# Patient Record
Sex: Male | Born: 1945 | Race: White | Hispanic: No | Marital: Married | Smoking: Never smoker
Health system: Southern US, Community
[De-identification: ages and names within clinical notes are randomized; demographics above are authoritative.]

## PROBLEM LIST (undated history)

## (undated) DIAGNOSIS — I4891 Unspecified atrial fibrillation: Secondary | ICD-10-CM

## (undated) DIAGNOSIS — F909 Attention-deficit hyperactivity disorder, unspecified type: Secondary | ICD-10-CM

## (undated) DIAGNOSIS — K5190 Ulcerative colitis, unspecified, without complications: Secondary | ICD-10-CM

## (undated) DIAGNOSIS — M48061 Spinal stenosis, lumbar region without neurogenic claudication: Secondary | ICD-10-CM

## (undated) DIAGNOSIS — M545 Low back pain, unspecified: Secondary | ICD-10-CM

## (undated) DIAGNOSIS — B2790 Infectious mononucleosis, unspecified without complication: Secondary | ICD-10-CM

## (undated) DIAGNOSIS — A071 Giardiasis [lambliasis]: Secondary | ICD-10-CM

## (undated) DIAGNOSIS — C801 Malignant (primary) neoplasm, unspecified: Secondary | ICD-10-CM

## (undated) DIAGNOSIS — K602 Anal fissure, unspecified: Secondary | ICD-10-CM

## (undated) DIAGNOSIS — H9112 Presbycusis, left ear: Secondary | ICD-10-CM

## (undated) DIAGNOSIS — N189 Chronic kidney disease, unspecified: Secondary | ICD-10-CM

## (undated) DIAGNOSIS — E785 Hyperlipidemia, unspecified: Secondary | ICD-10-CM

## (undated) HISTORY — DX: Infectious mononucleosis, unspecified without complication: B27.90

## (undated) HISTORY — PX: PROSTATE SURGERY: SHX751

## (undated) HISTORY — DX: Chronic kidney disease, unspecified: N18.9

## (undated) HISTORY — DX: Low back pain: M54.5

## (undated) HISTORY — DX: Low back pain, unspecified: M54.50

## (undated) HISTORY — PX: ANAL FISSURE REPAIR: SHX2312

## (undated) HISTORY — PX: APPENDECTOMY: SHX54

## (undated) HISTORY — DX: Spinal stenosis, lumbar region without neurogenic claudication: M48.061

## (undated) HISTORY — DX: Presbycusis, left ear: H91.12

## (undated) HISTORY — DX: Attention-deficit hyperactivity disorder, unspecified type: F90.9

## (undated) HISTORY — DX: Malignant (primary) neoplasm, unspecified: C80.1

## (undated) HISTORY — PX: VARICOCELE EXCISION: SUR582

## (undated) HISTORY — DX: Giardiasis (lambliasis): A07.1

## (undated) HISTORY — DX: Hyperlipidemia, unspecified: E78.5

## (undated) HISTORY — PX: ROTATOR CUFF REPAIR: SHX139

## (undated) HISTORY — DX: Anal fissure, unspecified: K60.2

## (undated) HISTORY — DX: Ulcerative colitis, unspecified, without complications: K51.90

## (undated) HISTORY — DX: Unspecified atrial fibrillation: I48.91

---

## 2000-03-17 ENCOUNTER — Ambulatory Visit (HOSPITAL_COMMUNITY): Admission: RE | Admit: 2000-03-17 | Discharge: 2000-03-17 | Payer: Self-pay | Admitting: Gastroenterology

## 2003-01-04 ENCOUNTER — Encounter: Payer: Self-pay | Admitting: General Surgery

## 2003-01-04 ENCOUNTER — Encounter: Admission: RE | Admit: 2003-01-04 | Discharge: 2003-01-04 | Payer: Self-pay | Admitting: General Surgery

## 2003-01-12 ENCOUNTER — Encounter (INDEPENDENT_AMBULATORY_CARE_PROVIDER_SITE_OTHER): Payer: Self-pay | Admitting: Specialist

## 2003-01-12 ENCOUNTER — Ambulatory Visit (HOSPITAL_BASED_OUTPATIENT_CLINIC_OR_DEPARTMENT_OTHER): Admission: RE | Admit: 2003-01-12 | Discharge: 2003-01-12 | Payer: Self-pay | Admitting: General Surgery

## 2007-01-29 ENCOUNTER — Ambulatory Visit (HOSPITAL_COMMUNITY): Admission: RE | Admit: 2007-01-29 | Discharge: 2007-01-29 | Payer: Self-pay | Admitting: Urology

## 2007-03-10 ENCOUNTER — Inpatient Hospital Stay (HOSPITAL_COMMUNITY): Admission: RE | Admit: 2007-03-10 | Discharge: 2007-03-11 | Payer: Self-pay | Admitting: Urology

## 2007-03-10 ENCOUNTER — Encounter (INDEPENDENT_AMBULATORY_CARE_PROVIDER_SITE_OTHER): Payer: Self-pay | Admitting: Urology

## 2009-01-17 ENCOUNTER — Encounter: Admission: RE | Admit: 2009-01-17 | Discharge: 2009-01-17 | Payer: Self-pay | Admitting: Rheumatology

## 2009-01-19 ENCOUNTER — Ambulatory Visit (HOSPITAL_COMMUNITY): Admission: RE | Admit: 2009-01-19 | Discharge: 2009-01-19 | Payer: Self-pay | Admitting: Rheumatology

## 2009-11-09 ENCOUNTER — Ambulatory Visit (HOSPITAL_COMMUNITY): Admission: RE | Admit: 2009-11-09 | Discharge: 2009-11-09 | Payer: Self-pay | Admitting: Orthopaedic Surgery

## 2009-11-30 ENCOUNTER — Ambulatory Visit (HOSPITAL_BASED_OUTPATIENT_CLINIC_OR_DEPARTMENT_OTHER): Admission: RE | Admit: 2009-11-30 | Discharge: 2009-11-30 | Payer: Self-pay | Admitting: Orthopaedic Surgery

## 2009-12-19 ENCOUNTER — Encounter: Admission: RE | Admit: 2009-12-19 | Discharge: 2010-03-15 | Payer: Self-pay | Admitting: Orthopaedic Surgery

## 2010-11-29 LAB — POCT HEMOGLOBIN-HEMACUE: Hemoglobin: 17.1 g/dL — ABNORMAL HIGH (ref 13.0–17.0)

## 2011-01-21 NOTE — H&P (Signed)
NAME:  Daniel Payne, Daniel Payne NO.:  [id number]   MEDICAL RECORD NO.:  [id number]          PATIENT TYPE:  INP   LOCATION:  51                         FACILITY:  [facility]   PHYSICIAN:  Duane Lope. Rosana Hoes, M.D.  DATE OF BIRTH:  1946-07-24   DATE OF ADMISSION:  03/10/2007  DATE OF DISCHARGE:                              HISTORY & PHYSICAL   CHIEF COMPLAINT:  I have prostate cancer.   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  He subsequently  underwent ultrasound and biopsy of the prostate which revealed a Gleason  score 7 which was 3+4 adenocarcinoma, 40% of three cores from the right  base and a lower grade Gleason score 6, 3+3 from the right apex.  His  metastatic workup was negative.  After understanding risks, benefits and  alternatives, he elected to proceed with a robotic-assisted radical  prostatectomy with bilateral pelvic lymphadenectomy.   PAST MEDICAL HISTORY:   ALLERGIES:  He has no known allergies.   MEDICATIONS:  1. Concerta.  2. Benadryl.  3. Vitamins.  4. Occasional ibuprofen.   ILLNESSES:  He has ADHD only.   SURGERIES:  He has had a right orchiopexy as a child; he has an atrophic  high-riding right testicle.   SOCIAL HISTORY:  He is a negative smoker, a social drinker.   FAMILY HISTORY:  Nonsignificant.   REVIEW OF SYSTEMS:  Has no shortness of breath, dyspnea on exertion,  chest pain or GI complaints.   PHYSICAL EXAMINATION:  VITAL SIGNS:  He is afebrile.  Vital signs  stable.  GENERAL:  He is well-nourished, well-developed, thin, in no acute  distress, oriented x3.  HEENT:  Normal.  NECK:  Supple without masses, thyromegaly or bruits.  CHEST:  Clear  without rales or rhonchi.  HEART:  Normal sinus rhythm without murmurs, gallops.  ABDOMEN:  Soft, nontender, without masses or organomegaly.  EXTREMITIES:  Normal.  NEUROLOGIC:  Intact.  SKIN:  Normal.  GENITOURINARY:  Penis, meatus, scrotum, testicle, _________ appear  normal.  RECTAL:  The prostate is 30 grams.  Sight soft nodularity but no  specific findings.   IMPRESSION:  Clinical stage T1c adenocarcinoma of prostate.   PLAN:  Robotic-assisted radical prostatectomy with bilateral pelvic  lymphadenectomy.      [place] Rosana Hoes, M.D.  Electronically Signed     RLD/MEDQ  D:  03/10/2007  T:  03/11/2007  Job:  [id number]

## 2011-01-21 NOTE — Op Note (Signed)
NAME:  Daniel Payne, Daniel Payne NO.:  [id number]   MEDICAL RECORD NO.:  [id number]          PATIENT TYPE:  INP   LOCATION:  13                         FACILITY:  [facility]   PHYSICIAN:  Duane Lope. Rosana Hoes, M.D.  DATE OF BIRTH:  Mar 06, 1946   DATE OF PROCEDURE:  03/10/2007  DATE OF DISCHARGE:                               OPERATIVE REPORT   DIAGNOSIS:  Adenocarcinoma of the prostate.   OPERATIVE PROCEDURE:  Robotic-assisted radical prostatectomy and  bilateral pelvic lymphadenectomy.   SURGEON:  Tresa Endo.   ASSISTANT:  Raynelle Bring.   ANESTHESIA:  General endotracheal.   ESTIMATED BLOOD LOSS:  150 mL.   TUBES:  Large round Blake drain, 20-French coude Foley catheter.   COMPLICATIONS:  None.   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  He was found to have a slight nodularity of the prostate on the  left side and subsequently underwent ultrasound and biopsy of the  prostate that revealed a Gleason score 7 which was 4 + 3 adenocarcinoma  in 40% of the biopsy from the right base of his prostate and a Gleason  score 6 which was 3 + 3 from the right apex.  Metastatic workup was  negative.  After understanding risks, benefits and alternatives, it was  elected to proceed with robotic-assisted radical retropubic radical  prostatectomy with pelvic lymphadenectomy.   PROCEDURE IN DETAIL:  The patient was placed in a supine position.  After proper general tracheal anesthesia, he was placed in exaggerated  lithotomy position, prepped and draped with Betadine in sterile fashion.  A 24-French Foley catheter was inserted with 30 mL balloon, and the  bladder was drained.  A left periumbilical incision was made, and a 12-  French cannula was placed directly, and the abdomen was insufflated with  carbon dioxide and inspected.  There was one adhesion on the right side,  but  we were able to avoid it.  No other lesions were noted to be  present.  Right and left robotic arms were placed one handbreadth  lateral to the periumbilical port.  A fourth arm port was placed lateral  and slightly superior to the left arm port, and two working ports, a 5-  mm left port and a 12-mm right port, were placed medial and lateral to  the right working arm port under direct vision.  The robot was docked,  and the dissection was begun.  The space of Retzius was entered.  The  anterior bladder flap was taken down, and the medial and median  umbilical ligaments were taken down.  Area was defatted, and the  endopelvic fascia was dissected bilaterally.  The puboprostatic  ligaments were partially resected.  Following this, the dorsal vein  complex was then clamped, cut with an Endo-GIA stapler, and the bladder  neck was approached and dissected from the prostate, carried  posteriorly, and the seminal vesicles and ampulla of vas deferens were  taken down, and ampulla of vas deferens were amputated,  and good  hemostasis was noted be present.  Denonvilliers fascia was then entered,  and the plane was created posterior to the prostate.  A nerve spare was  then performed, and the posterior lateral tissue and a veil technique  was taken down sharply avoiding cautery to retract both of the nerves  laterally, and the vascular pedicles were taken down in serial packets  and clipped with Hem-o-lok clips.  The dissection was carried to the  apex of prostate, again carefully avoiding the neurovascular bundle, and  the apex and urethra were taken down sharply, and the speculum was  placed in the right lower quadrant.  Good hemostasis was noted to be  present.  Bilateral lymphadenectomy was performed.  All lymphatics were  clipped.  Obturator nerves were identified and protected bilaterally,  and obturator and external iliac lymph nodes progressing to the  hypogastric lymph nodes were taken down  and were submitted separately to  pathology, and good hemostasis noted be present.  Utilizing a 2-0 Vicryl  stitch, a supporting stitch was placed on Denonvilliers fascia to the  posterior urethral area, and a holding stitch was placed in the 6  o'clock position from the urethra to the bladder neck.  The bladder neck  appeared to be intact.  Utilizing a 3-0 Monocryl dyed and undyed suture  with the knot posterior to the bladder, a running anastomosis was  performed and tied anteriorly, and a 20-French coude catheter was easily  passed to the bladder, and the bladder was noted to irrigate clear.  Indigo carmine had been given, and blue dye was noted to be easily  irrigated.  Reinspection revealed good hemostasis was present.  The  anastomosis appeared to be in shape.  The robot was undocked and moved  away, and the right 12-mm port was closed with a suture passer and 2-0  Vicryl suture under direct vision.  The specimen was captured through  the periumbilical port with an EndoCatch bag, and other ports were  removed under direct vision, and there was no significant bleeding.  The  specimen was then removed periumbilically.  The fascia was closed with a  running of 2-0 Vicryl suture.  It might be noted that a large round  Blake drain had been placed through the fourth arm port, placed into the  pelvis and sutured in place with nylon, and all wounds were injected  with quarter percent Marcaine and closed with skin staples and dressed  in a sterile fashion.  The patient tolerated the procedure well.  There  were no complications, and he was taken to the recovery room stable.      [place] Rosana Hoes, M.D.  Electronically Signed     RLD/MEDQ  D:  03/10/2007  T:  03/11/2007  Job:  [id number]

## 2011-01-24 NOTE — Op Note (Signed)
NAME:  Daniel Payne, Daniel Payne                          ACCOUNT NO.:  [id number]   MEDICAL RECORD NO.:  [id number]                   PATIENT TYPE:  AMB   LOCATION:  [place]                                  FACILITY:  [facility]   PHYSICIAN:  Orson Ape. Rise Patience, M.D.          DATE OF BIRTH:  07-May-1946   DATE OF PROCEDURE:  01/12/2003  DATE OF DISCHARGE:                                 OPERATIVE REPORT   PREOPERATIVE DIAGNOSIS:  Chronic posterior anal fissure.   POSTOPERATIVE DIAGNOSIS:  Chronic posterior anal fissure.   OPERATION PERFORMED:  Excision of chronic posterior anal fissure and  internal sphincterotomy.   SURGEON:  Orson Ape. Rise Patience, M.D.   ANESTHESIA:  General.   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  He saw me approximately two months ago  with a chronic lump and pain posterior to his anus.  He has had a  colonoscopy by Dr. Sammuel Cooper and no abnormalities were noted.  He has noticed  a little soiling and occasionally a little bleeding.  He thought this was a  fissure but he had treated himself with hemorrhoidal creams, ointments and  etc. but continued to have symptoms.  On examination, you could see a  chronic posterior fissure with kind of a hypertrophied pile and I  recommended we continue with the conservative management, but if the  symptoms were just not resolving, to proceed with an internal  sphincterotomy.  Over the next month there was basically no improvement and  he is scheduled now for an internal sphincterotomy.  His health otherwise is  good.   Preoperative laboratory studies, EKG, chest x-ray, CBC and CMET were normal.   DESCRIPTION OF PROCEDURE:  The patient was given a gram of Kefzol, induction  of general anesthesia and LOA tube and then placed in lithotomy position.  In this position, you could see this chronic posterior fissure with  hypertrophied skin edges and an anoscope was placed  and he has very minimal  internal hemorrhoids in the other quadrant.  This is more of a fissure.  You  can see the exposed sphincter fibers.  Because of the little polypoid like  tissue posterior, I elected to go ahead and excise this and do the  sphincterotomy posterior instead of doing a lateral sphincterotomy.  The  patient was then prepped with Betadine surgical scrub and solution and  draped in a sterile manner.  With the anoscope in the area, I elected to  just debride this hypertrophied tissue around this, elevating it off the  internal sphincter and then a hemostat was placed between the posterior and  internal sphincter.  The sphincter itself is not extremely large, but he is  of small stature.  There is kind of like a bridge defect where this catches  stool.  The sphincter was divided.  There was a little bleeder  that was  controlled with cautery and then the proximal hypertrophied tissue was also  debrided.  I did send this specimen for permanent path but I think it is  just chronic irritated tissue.  Next, the mucosal edges starting up high  were sutured with a running 2-0 chromic closing the little skin over the  sphincter.  I left the most posterior portion open so if there is any  drainage, it would have an escape route.  I placed one subcuticular 2-0  chromic suture most posterior.  Then we had the patient basically lightened  with anesthesia and I think that the sphincter internal portion has been  completed divided and then I put about 58m of 0.5% Marcaine with Adrenalin  in for postoperative pain and spasm.  Xylocaine ointment was applied and 4 x  4s  then held in place with a little stretch bandage.  The patient tolerated the  procedure nicely and will be released after a short stay in the recovery  room.  He hopes to be able to return to work next week and he will keep his  stool soft but try to avoid diarrhea.                                                 WOrson Ape WRise Patience M.D.    WJW/MEDQ  D:  01/12/2003  T:  01/13/2003  Job:  [id number]

## 2011-06-09 ENCOUNTER — Telehealth: Payer: Self-pay | Admitting: *Deleted

## 2011-06-09 NOTE — Telephone Encounter (Signed)
A user error has taken place: pull up wrong patient....06/09/11@3 :53pm/LMB

## 2011-06-24 LAB — BASIC METABOLIC PANEL
BUN: 9
CO2: 26
Calcium: 8.3 — ABNORMAL LOW
Chloride: 100
Creatinine, Ser: 1.02
GFR calc Af Amer: >60
GFR calc non Af Amer: >60
Glucose, Bld: 185 — ABNORMAL HIGH
Potassium: 3.9
Sodium: 131 — ABNORMAL LOW

## 2011-06-24 LAB — DIFFERENTIAL
Basophils Absolute: 0
Basophils Relative: 0
Eosinophils Absolute: 0
Eosinophils Relative: 0
Lymphocytes Relative: 15
Lymphs Abs: 1.3
Monocytes Absolute: 0.7
Monocytes Relative: 9
Neutro Abs: 6.5
Neutrophils Relative %: 76

## 2011-06-24 LAB — CBC
HCT: 38.3 — ABNORMAL LOW
Hemoglobin: 13.4
MCHC: 35
MCV: 89.9
Platelets: 190
RBC: 4.26
RDW: 12.8
WBC: 8.6

## 2011-06-24 LAB — TYPE AND SCREEN
ABO/RH(D): AB POS
Antibody Screen: NEGATIVE

## 2011-06-24 LAB — ABO/RH: ABO/RH(D): AB POS

## 2011-06-25 LAB — PROTIME-INR
INR: 1
Prothrombin Time: 13.4

## 2011-06-25 LAB — COMPREHENSIVE METABOLIC PANEL
ALT: 26
AST: 23
Albumin: 3.7
Alkaline Phosphatase: 57
BUN: 24 — ABNORMAL HIGH
CO2: 27
Calcium: 9.7
Chloride: 102
Creatinine, Ser: 1.35
GFR calc Af Amer: >60
GFR calc non Af Amer: 54 — ABNORMAL LOW
Glucose, Bld: 91
Potassium: 4.2
Sodium: 139
Total Bilirubin: 0.7
Total Protein: 7.1

## 2011-06-25 LAB — URINALYSIS, ROUTINE W REFLEX MICROSCOPIC
Bilirubin Urine: NEGATIVE
Glucose, UA: NEGATIVE
Hgb urine dipstick: NEGATIVE
Ketones, ur: NEGATIVE
Nitrite: NEGATIVE
Protein, ur: NEGATIVE
Specific Gravity, Urine: 1.023
Urobilinogen, UA: 0.2
pH: 7

## 2011-06-25 LAB — CBC
HCT: 44.1
Hemoglobin: 15.6
MCHC: 35.2
MCV: 89.3
Platelets: 245
RBC: 4.94
RDW: 12.9
WBC: 6.9

## 2011-06-25 LAB — APTT: aPTT: 28

## 2012-10-27 DIAGNOSIS — M48061 Spinal stenosis, lumbar region without neurogenic claudication: Secondary | ICD-10-CM | POA: Diagnosis not present

## 2013-03-22 DIAGNOSIS — Z79899 Other long term (current) drug therapy: Secondary | ICD-10-CM | POA: Diagnosis not present

## 2013-03-22 DIAGNOSIS — C61 Malignant neoplasm of prostate: Secondary | ICD-10-CM | POA: Diagnosis not present

## 2013-03-22 DIAGNOSIS — H9190 Unspecified hearing loss, unspecified ear: Secondary | ICD-10-CM | POA: Diagnosis not present

## 2013-03-22 DIAGNOSIS — Z0000 Encounter for general adult medical examination without abnormal findings: Secondary | ICD-10-CM | POA: Diagnosis not present

## 2013-03-22 DIAGNOSIS — E7800 Pure hypercholesterolemia, unspecified: Secondary | ICD-10-CM | POA: Diagnosis not present

## 2013-03-29 DIAGNOSIS — E7800 Pure hypercholesterolemia, unspecified: Secondary | ICD-10-CM | POA: Diagnosis not present

## 2013-03-29 DIAGNOSIS — C61 Malignant neoplasm of prostate: Secondary | ICD-10-CM | POA: Diagnosis not present

## 2013-03-29 DIAGNOSIS — Z79899 Other long term (current) drug therapy: Secondary | ICD-10-CM | POA: Diagnosis not present

## 2013-04-06 DIAGNOSIS — H905 Unspecified sensorineural hearing loss: Secondary | ICD-10-CM | POA: Diagnosis not present

## 2013-04-06 DIAGNOSIS — H903 Sensorineural hearing loss, bilateral: Secondary | ICD-10-CM | POA: Diagnosis not present

## 2013-06-14 DIAGNOSIS — Z79899 Other long term (current) drug therapy: Secondary | ICD-10-CM | POA: Diagnosis not present

## 2013-06-14 DIAGNOSIS — E7800 Pure hypercholesterolemia, unspecified: Secondary | ICD-10-CM | POA: Diagnosis not present

## 2013-06-21 DIAGNOSIS — Z23 Encounter for immunization: Secondary | ICD-10-CM | POA: Diagnosis not present

## 2014-03-21 DIAGNOSIS — C61 Malignant neoplasm of prostate: Secondary | ICD-10-CM | POA: Diagnosis not present

## 2014-03-28 DIAGNOSIS — Z0000 Encounter for general adult medical examination without abnormal findings: Secondary | ICD-10-CM | POA: Diagnosis not present

## 2014-03-28 DIAGNOSIS — Z23 Encounter for immunization: Secondary | ICD-10-CM | POA: Diagnosis not present

## 2014-03-28 DIAGNOSIS — Z1331 Encounter for screening for depression: Secondary | ICD-10-CM | POA: Diagnosis not present

## 2014-03-28 DIAGNOSIS — K625 Hemorrhage of anus and rectum: Secondary | ICD-10-CM | POA: Diagnosis not present

## 2014-03-28 DIAGNOSIS — Z79899 Other long term (current) drug therapy: Secondary | ICD-10-CM | POA: Diagnosis not present

## 2014-03-28 DIAGNOSIS — E7800 Pure hypercholesterolemia, unspecified: Secondary | ICD-10-CM | POA: Diagnosis not present

## 2014-05-09 DIAGNOSIS — K921 Melena: Secondary | ICD-10-CM | POA: Diagnosis not present

## 2014-05-09 DIAGNOSIS — R197 Diarrhea, unspecified: Secondary | ICD-10-CM | POA: Diagnosis not present

## 2014-05-17 DIAGNOSIS — K921 Melena: Secondary | ICD-10-CM | POA: Diagnosis not present

## 2014-05-26 ENCOUNTER — Other Ambulatory Visit: Payer: Self-pay | Admitting: Gastroenterology

## 2014-05-26 DIAGNOSIS — R195 Other fecal abnormalities: Secondary | ICD-10-CM | POA: Diagnosis not present

## 2014-05-26 DIAGNOSIS — R197 Diarrhea, unspecified: Secondary | ICD-10-CM | POA: Diagnosis not present

## 2014-05-26 DIAGNOSIS — K5289 Other specified noninfective gastroenteritis and colitis: Secondary | ICD-10-CM | POA: Diagnosis not present

## 2014-05-26 DIAGNOSIS — K5730 Diverticulosis of large intestine without perforation or abscess without bleeding: Secondary | ICD-10-CM | POA: Diagnosis not present

## 2014-05-26 DIAGNOSIS — D126 Benign neoplasm of colon, unspecified: Secondary | ICD-10-CM | POA: Diagnosis not present

## 2014-05-26 DIAGNOSIS — K6389 Other specified diseases of intestine: Secondary | ICD-10-CM | POA: Diagnosis not present

## 2014-06-13 DIAGNOSIS — K5150 Left sided colitis without complications: Secondary | ICD-10-CM | POA: Diagnosis not present

## 2014-07-01 DIAGNOSIS — Z23 Encounter for immunization: Secondary | ICD-10-CM | POA: Diagnosis not present

## 2014-07-25 DIAGNOSIS — K5150 Left sided colitis without complications: Secondary | ICD-10-CM | POA: Diagnosis not present

## 2014-08-02 DIAGNOSIS — K921 Melena: Secondary | ICD-10-CM | POA: Diagnosis not present

## 2014-08-23 DIAGNOSIS — H2513 Age-related nuclear cataract, bilateral: Secondary | ICD-10-CM | POA: Diagnosis not present

## 2014-09-12 DIAGNOSIS — K5150 Left sided colitis without complications: Secondary | ICD-10-CM | POA: Diagnosis not present

## 2014-10-02 DIAGNOSIS — K5150 Left sided colitis without complications: Secondary | ICD-10-CM | POA: Diagnosis not present

## 2014-10-02 DIAGNOSIS — Z111 Encounter for screening for respiratory tuberculosis: Secondary | ICD-10-CM | POA: Diagnosis not present

## 2014-10-10 ENCOUNTER — Other Ambulatory Visit: Payer: Self-pay | Admitting: Gastroenterology

## 2014-10-10 DIAGNOSIS — K5150 Left sided colitis without complications: Secondary | ICD-10-CM | POA: Diagnosis not present

## 2014-10-10 DIAGNOSIS — Z5181 Encounter for therapeutic drug level monitoring: Secondary | ICD-10-CM | POA: Diagnosis not present

## 2014-10-10 DIAGNOSIS — K5190 Ulcerative colitis, unspecified, without complications: Secondary | ICD-10-CM | POA: Diagnosis not present

## 2014-10-10 DIAGNOSIS — K589 Irritable bowel syndrome without diarrhea: Secondary | ICD-10-CM | POA: Diagnosis not present

## 2014-10-10 DIAGNOSIS — Z09 Encounter for follow-up examination after completed treatment for conditions other than malignant neoplasm: Secondary | ICD-10-CM | POA: Diagnosis not present

## 2014-10-10 DIAGNOSIS — Z8719 Personal history of other diseases of the digestive system: Secondary | ICD-10-CM | POA: Diagnosis not present

## 2014-10-11 DIAGNOSIS — E875 Hyperkalemia: Secondary | ICD-10-CM | POA: Diagnosis not present

## 2014-10-19 DIAGNOSIS — I4891 Unspecified atrial fibrillation: Secondary | ICD-10-CM | POA: Diagnosis not present

## 2014-10-30 DIAGNOSIS — S299XXA Unspecified injury of thorax, initial encounter: Secondary | ICD-10-CM | POA: Diagnosis not present

## 2014-10-30 DIAGNOSIS — Y92838 Other recreation area as the place of occurrence of the external cause: Secondary | ICD-10-CM | POA: Diagnosis not present

## 2014-10-30 DIAGNOSIS — S2020XA Contusion of thorax, unspecified, initial encounter: Secondary | ICD-10-CM | POA: Diagnosis not present

## 2014-11-07 ENCOUNTER — Ambulatory Visit (INDEPENDENT_AMBULATORY_CARE_PROVIDER_SITE_OTHER): Payer: Medicare Other | Admitting: Cardiovascular Disease

## 2014-11-07 ENCOUNTER — Encounter: Payer: Self-pay | Admitting: Cardiovascular Disease

## 2014-11-07 VITALS — BP 132/74 | HR 87 | Ht 68.5 in | Wt 155.0 lb

## 2014-11-07 DIAGNOSIS — C61 Malignant neoplasm of prostate: Secondary | ICD-10-CM | POA: Diagnosis not present

## 2014-11-07 DIAGNOSIS — I480 Paroxysmal atrial fibrillation: Secondary | ICD-10-CM | POA: Diagnosis not present

## 2014-11-07 DIAGNOSIS — R079 Chest pain, unspecified: Secondary | ICD-10-CM

## 2014-11-07 DIAGNOSIS — Z5181 Encounter for therapeutic drug level monitoring: Secondary | ICD-10-CM | POA: Diagnosis not present

## 2014-11-07 NOTE — Progress Notes (Signed)
Cardiology Office Note   Date:  11/07/2014   ID:  Daniel Payne, DOB 11-01-1945, MRN [id number]  PCP:  No primary care provider on file.  Cardiologist:   Kyesha Balla, Wonda Cheng, MD   Chief Complaint  Patient presents with  . Atrial Fibrillation   1. Problem List:  1. Paroxysmal Atrial fib 2. Hyperlipidemia 3. Prostate surgery-status post robotic prostatectomy 4. Ulcerative colitis   History of Present Illness: Daniel Payne is a 69 y.o. 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  Has not fully controlled the bleeding. Best controlled by steroids.    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 He was cycling and went into rapid Afib with rate of 170.  He converted back to NSR in 3-4 hours. Had another episode of Afib while working in his garden.  Went to Dr.  Carlyle Lipa office . Afib was documeneted.  Was started on Dilt 30 QID.  Converted to NSR the next day He stopped the Dilt a week or so later.  Has developed episodes of sinus tachycardia recently.  Also associated with some chest tightness.  Does not last long.   This am he had very slight chest tightness while he was walking.  Lasted 5  Minutes.  No diaphoresis, no radiation.  Drinks wine with dinner.   Past Medical History  Diagnosis Date  . Low back pain   . Hyperlipidemia   . Fissure, anal   . ADHD (attention deficit hyperactivity disorder)   . Giardia     working in [place]  . Infectious mononucleosis   . Cancer     prostate dr. Lawerance Bach  . Spinal stenosis of lumbar region   . Chronic kidney disease     borderline stage III  . Presbycusis of left ear   . Ulcerative colitis   . A-fib     Past Surgical History  Procedure Laterality Date  . Prostate surgery    . Anal fissure repair    . Varicocele excision Right   . Appendectomy    . Rotator cuff repair Left       Current Outpatient Prescriptions  Medication Sig Dispense Refill  . atorvastatin (LIPITOR) 10 MG tablet Take 10 mg by mouth daily.  11  . ibuprofen (ADVIL,MOTRIN) 600 MG tablet Take 600 mg by mouth at bedtime.     . mercaptopurine (PURINETHOL) 50 MG tablet Take 50 mg by mouth daily.   12  . predniSONE (DELTASONE) 5 MG tablet Take 15 mg by mouth daily with breakfast.   3   No current facility-administered medications for this visit.    Allergies:   Concerta; Hydrocortisone; and Lialda    Social History:  The patient  reports that he has never smoked. He does not have any smokeless tobacco history on file. He reports that he drinks alcohol. He reports that he does not use illicit drugs.   Family History:  The patient's family history includes Stroke (age of onset: 58) in his mother.    ROS:  Please see the history of present illness.    Review of Systems: Constitutional:  denies fever, chills, diaphoresis, appetite change and fatigue.  HEENT: denies photophobia, eye pain, redness, hearing loss, ear pain, congestion, sore throat, rhinorrhea, sneezing, neck pain, neck stiffness and tinnitus.  Respiratory: denies SOB, DOE, cough, chest tightness, and wheezing.  Cardiovascular: admits to chest pain, palpitations    Gastrointestinal: denies nausea, vomiting, abdominal pain, diarrhea, constipation, blood in stool.  Genitourinary: denies dysuria, urgency, frequency, hematuria, flank pain and difficulty urinating.  Musculoskeletal: denies  myalgias, back pain, joint swelling, arthralgias and gait problem.   Skin: denies pallor, rash and wound.  Neurological: denies dizziness, seizures, syncope, weakness, light-headedness, numbness and headaches.   Hematological: denies adenopathy, easy bruising, personal or family bleeding history.  Psychiatric/ Behavioral: denies suicidal ideation, mood changes, confusion, nervousness, sleep disturbance and agitation.       All other systems are  reviewed and negative.    PHYSICAL EXAM: VS:  BP 132/76 mmHg  Pulse 87  Ht 5' 8.5" (1.74 m) , BMI There is no weight on file to calculate BMI. GEN: Well nourished, well developed, in no acute distress HEENT: normal Neck: no JVD, carotid bruits, or masses Cardiac: RRR; no murmurs, rubs, or gallops,no edema  Respiratory:  clear to auscultation bilaterally, normal work of breathing GI: soft, nontender, nondistended, + BS MS: no deformity or atrophy Skin: warm and dry, no rash Neuro:  Strength and sensation are intact Psych: normal   EKG:  EKG is ordered today. The ekg ordered today demonstrates normal sinus rhythm at a rate of 87. He has no ST or T wave changes.   Recent Labs: No results found for requested labs within last 365 days.    Lipid Panel No results found for: CHOL, TRIG, HDL, CHOLHDL, VLDL, LDLCALC, LDLDIRECT    Wt Readings from Last 3 Encounters:  No data found for Wt      Other studies Reviewed: Additional studies/ records that were reviewed today include: . Review of the above records demonstrates:    ASSESSMENT AND PLAN:  1.  Paroxysmal atrial fibrillation- can presents with episodes of paroxysmal atrial fibrillation. He's had 2 episodes. One lasted for 3 hours and the other lasted for about a day. His HADS2 VASC score is 1 ( age) .  We discussed starting him on anti-coagulation. He still having some issues with colitis that he would see would like to hold off on starting any anticoagulations at this time. In addition, he is not really having much in the way of atrial fibrillation. He took diltiazem for several days but now is discontinued it. He's not had any recurrent episodes of atrial flutter ablation. I'm not inclined to start him on any additional medications at this time.  We'll get an echocardiogram for further evaluation of his LV function and valvular function. Because he also has had some chest discomfort associated with a sinus tachycardia, we'll  get a stress Myoview study.  2. Chest discomfort - he's had some episodes of chest discomfort. These are fairly atypical for him  so this is a bit concerning. 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  I'll see him again in 3 months for follow-up visit.   Current medicines are reviewed at length with the patient today.  The patient does not have concerns regarding medicines.  The following changes have been made:  no change   Disposition:   FU with me in 3 months.     Signed, Gearlene Godsil, Wonda Cheng, MD  11/07/2014 1:57 PM    [facility] [place], [place], [place]  [address] Phone: [phone]; Fax: [phone]

## 2014-11-07 NOTE — Patient Instructions (Addendum)
Your physician has requested that you have en exercise stress myoview. For further information please visit [email or web address]. Please follow instruction sheet, as given.  Your physician recommends that you continue on your current medications as directed. Please refer to the Current Medication list given to you today.  Your physician has requested that you have an echocardiogram. Echocardiography is a painless test that uses sound waves to create images of your heart. It provides your doctor with information about the size and shape of your heart and how well your heart's chambers and valves are working. This procedure takes approximately one hour. There are no restrictions for this procedure.   Your physician recommends that you schedule a follow-up appointment in: 3 months with Dr. Acie Fredrickson.

## 2014-11-13 ENCOUNTER — Other Ambulatory Visit (HOSPITAL_COMMUNITY): Payer: Medicare Other

## 2014-11-13 ENCOUNTER — Encounter (HOSPITAL_COMMUNITY): Payer: Medicare Other

## 2014-11-24 ENCOUNTER — Ambulatory Visit (HOSPITAL_BASED_OUTPATIENT_CLINIC_OR_DEPARTMENT_OTHER): Payer: Medicare Other | Admitting: Radiology

## 2014-11-24 ENCOUNTER — Ambulatory Visit (HOSPITAL_COMMUNITY): Payer: Medicare Other | Attending: Cardiology | Admitting: Radiology

## 2014-11-24 DIAGNOSIS — E785 Hyperlipidemia, unspecified: Secondary | ICD-10-CM | POA: Diagnosis not present

## 2014-11-24 DIAGNOSIS — I4891 Unspecified atrial fibrillation: Secondary | ICD-10-CM | POA: Diagnosis not present

## 2014-11-24 DIAGNOSIS — R079 Chest pain, unspecified: Secondary | ICD-10-CM | POA: Diagnosis not present

## 2014-11-24 DIAGNOSIS — I480 Paroxysmal atrial fibrillation: Secondary | ICD-10-CM | POA: Diagnosis not present

## 2014-11-24 DIAGNOSIS — I340 Nonrheumatic mitral (valve) insufficiency: Secondary | ICD-10-CM | POA: Insufficient documentation

## 2014-11-24 MED ORDER — TECHNETIUM TC 99M SESTAMIBI GENERIC - CARDIOLITE
11.0000 | Freq: Once | INTRAVENOUS | Status: AC | PRN
  Administered 2014-11-24: 11 via INTRAVENOUS

## 2014-11-24 MED ORDER — TECHNETIUM TC 99M SESTAMIBI GENERIC - CARDIOLITE
33.0000 | Freq: Once | INTRAVENOUS | Status: AC | PRN
  Administered 2014-11-24: 33 via INTRAVENOUS

## 2014-11-24 NOTE — Progress Notes (Signed)
Echocardiogram performed.  

## 2014-11-24 NOTE — Progress Notes (Signed)
[facility] [address] [place], [place] [address] [phone]    Cardiology Nuclear Med Study  Daniel Payne is a 69 y.o. male     MRN : [id number]     DOB: [birth year]  Procedure Date: 11/24/2014  Nuclear Med Background Indication for Stress Test:  Evaluation for Ischemia History:  No known CAD Cardiac Risk Factors: N/A  Symptoms:  Chest Tightness   Nuclear Pre-Procedure Caffeine/Decaff Intake:  None NPO After: 7:00pm   Lungs:  clear O2 Sat: 96% on room air. IV 0.9% NS with Angio Cath:  22g  IV Site: R Hand  IV Started by:  Crissie Figures, RN  Chest Size (in):  42 Cup Size: n/a  Height: 5' 8.5" (1.74 m)  Weight:  155 lb (70.308 kg)  BMI:  Body mass index is 23.22 kg/(m^2). Tech Comments:  N/A    Nuclear Med Study 1 or 2 day study: 1 day  Stress Test Type:  Stress  Reading MD: N/A  Order Authorizing Provider:  Mertie Moores, MD  Resting Radionuclide: Technetium 109mSestamibi  Resting Radionuclide Dose: 11.0 mCi   Stress Radionuclide:  Technetium 984mestamibi  Stress Radionuclide Dose: 33.0 mCi           Stress Protocol Rest HR: 75 Stress HR: 150  Rest BP: 131/83 Stress BP: 186/81  Exercise Time (min): 6:00 METS: 7.0   Predicted Max HR: 150 bpm % Max HR: 100 bpm     Dose of Adenosine (mg):  n/a Dose of Lexiscan: n/a mg  Dose of Atropine (mg): n/a Dose of Dobutamine: n/a mcg/kg/min (at max HR)  Stress Test Technologist: ShGlade LloydBS-ES  Nuclear Technologist:  ElEarl ManyCNMT     Rest Procedure:  Myocardial perfusion imaging was performed at rest 45 minutes following the intravenous administration of Technetium 9958mstamibi. Rest ECG: NSR-LVH  Stress Procedure:  The patient exercised on the treadmill utilizing the Bruce Protocol for 6:00 minutes. The patient stopped due to fatigue and denied any chest pain.  Technetium 32m69mtamibi was injected at peak exercise and myocardial perfusion imaging was performed after a brief  delay. Stress ECG: No significant change from baseline ECG  QPS Raw Data Images:  Normal; no motion artifact; normal heart/lung ratio. Stress Images:  Normal homogeneous uptake in all areas of the myocardium. Rest Images:  Normal homogeneous uptake in all areas of the myocardium. Subtraction (SDS):  No evidence of ischemia. Transient Ischemic Dilatation (Normal <1.22):  0.87 Lung/Heart Ratio (Normal <0.45):  0.25  Quantitative Gated Spect Images QGS EDV:  71 ml QGS ESV:  18 ml  Impression Exercise Capacity:  Fair exercise capacity. BP Response:  Normal blood pressure response. Clinical Symptoms:  No significant symptoms noted. ECG Impression:  No significant ST segment change suggestive of ischemia. Comparison with Prior Nuclear Study: No images to compare  Overall Impression:  Normal stress nuclear study.  LV Ejection Fraction: 75%.  LV Wall Motion:  NL LV Function; NL Wall Motion  PeteJenkins [place]

## 2014-12-06 DIAGNOSIS — Z5181 Encounter for therapeutic drug level monitoring: Secondary | ICD-10-CM | POA: Diagnosis not present

## 2014-12-06 DIAGNOSIS — K5150 Left sided colitis without complications: Secondary | ICD-10-CM | POA: Diagnosis not present

## 2014-12-06 DIAGNOSIS — M549 Dorsalgia, unspecified: Secondary | ICD-10-CM | POA: Diagnosis not present

## 2014-12-21 DIAGNOSIS — L550 Sunburn of first degree: Secondary | ICD-10-CM | POA: Diagnosis not present

## 2014-12-21 DIAGNOSIS — L820 Inflamed seborrheic keratosis: Secondary | ICD-10-CM | POA: Diagnosis not present

## 2014-12-28 DIAGNOSIS — K5150 Left sided colitis without complications: Secondary | ICD-10-CM | POA: Diagnosis not present

## 2015-01-15 DIAGNOSIS — K5180 Other ulcerative colitis without complications: Secondary | ICD-10-CM | POA: Diagnosis not present

## 2015-02-08 ENCOUNTER — Encounter: Payer: Self-pay | Admitting: Cardiovascular Disease

## 2015-02-08 ENCOUNTER — Ambulatory Visit (INDEPENDENT_AMBULATORY_CARE_PROVIDER_SITE_OTHER): Payer: Medicare Other | Admitting: Cardiovascular Disease

## 2015-02-08 VITALS — BP 108/76 | HR 65 | Ht 68.5 in | Wt 159.6 lb

## 2015-02-08 DIAGNOSIS — I480 Paroxysmal atrial fibrillation: Secondary | ICD-10-CM

## 2015-02-08 NOTE — Progress Notes (Signed)
Cardiology Office Note   Date:  02/08/2015   ID:  [name]  DR RAIFORD FETTERMAN, DOB 02-Jul-1946, MRN [id number]  PCP:  Mathews Argyle, MD  Cardiologist:   Thayer Headings, MD   Chief Complaint  Patient presents with  . Atrial Fibrillation   1. Problem List:  1. Paroxysmal Atrial fib 2. Hyperlipidemia 3. Prostate surgery-status post robotic prostatectomy 4. Ulcerative colitis   History of Present Illness: Raden  DR DEZ STAUFFER is a 69 y.o. 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  Has not fully controlled the bleeding. Best controlled by steroids.    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 He was cycling and went into rapid Afib with rate of 170.  He converted back to NSR in 3-4 hours. Had another episode of Afib while working in his garden.  Went to Dr.  Carlyle Lipa office . Afib was documeneted.  Was started on Dilt 30 QID.  Converted to NSR the next day He stopped the Dilt a week or so later.  Has developed episodes of sinus tachycardia recently.  Also associated with some chest tightness.  Does not last long.   This am he had very slight chest tightness while he was walking.  Lasted 5  Minutes.  No diaphoresis, no radiation.  Drinks wine with dinner.  02/08/2015:  Skin is doing well. He has paroxysmal atrial fibrillation. He has not wanted to start anticoagulation yet. He's had some problems with colitis. He had a stress Myoview study as part of his workup for some vague chest tightness. Myoview revealed no evidence of ischemia. He had normal left ventricle systolic function with an ejection fraction of 75%. Has been started on Humira for Ulcerative colits . Has been cycling quite a bit.  No issues.   No recurrent episodes of atrial fib .  He thinks it may have been related to the high dose prednisone   Past Medical History  Diagnosis  Date  . Low back pain   . Hyperlipidemia   . Fissure, anal   . ADHD (attention deficit hyperactivity disorder)   . Giardia     working in [place]  . Infectious mononucleosis   . Cancer     prostate dr. Lawerance Bach  . Spinal stenosis of lumbar region   . Chronic kidney disease     borderline stage III  . Presbycusis of left ear   . Ulcerative colitis   . A-fib     Past Surgical History  Procedure Laterality Date  . Prostate surgery    . Anal fissure repair    . Varicocele excision Right   . Appendectomy    . Rotator cuff repair Left      Current Outpatient Prescriptions  Medication Sig Dispense Refill  . acetaminophen (TYLENOL) 500 MG tablet Take 500 mg by mouth every 6 (six) hours as needed.    Marland Kitchen aspirin 81 MG tablet Take 81 mg by mouth daily.    Marland Kitchen atorvastatin (LIPITOR) 10 MG tablet Take 10 mg by mouth daily.  11  . diphenhydrAMINE (BENADRYL) 25 MG tablet Take 25 mg by mouth every 6 (six) hours as needed.    Marland Kitchen HUMIRA PEN 40 MG/0.8ML PNKT Inject as directed. EVERY 2 WEEKS  11  . predniSONE (DELTASONE) 5 MG tablet Take 15 mg by mouth daily with breakfast.  3   No current facility-administered medications for this visit.    Allergies:   Concerta; Hydrocortisone; and Lialda    Social History:  The patient  reports that he has never smoked. He does not have any smokeless tobacco history on file. He reports that he drinks alcohol. He reports that he does not use illicit drugs.   Family History:  The patient's family history includes Heart attack in his father; Stroke (age of onset: 91) in his mother.    ROS:  Please see the history of present illness.    Review of Systems: Constitutional:  denies fever, chills, diaphoresis, appetite change and fatigue.  HEENT: denies photophobia, eye pain, redness, hearing loss, ear pain, congestion, sore throat, rhinorrhea, sneezing, neck pain, neck stiffness and tinnitus.  Respiratory: denies SOB, DOE, cough, chest tightness, and  wheezing.  Cardiovascular: admits to chest pain, palpitations    Gastrointestinal: denies nausea, vomiting, abdominal pain, diarrhea, constipation, blood in stool.  Genitourinary: denies dysuria, urgency, frequency, hematuria, flank pain and difficulty urinating.  Musculoskeletal: denies  myalgias, back pain, joint swelling, arthralgias and gait problem.   Skin: denies pallor, rash and wound.  Neurological: denies dizziness, seizures, syncope, weakness, light-headedness, numbness and headaches.   Hematological: denies adenopathy, easy bruising, personal or family bleeding history.  Psychiatric/ Behavioral: denies suicidal ideation, mood changes, confusion, nervousness, sleep disturbance and agitation.       All other systems are reviewed and negative.    PHYSICAL EXAM: VS:  BP 108/76 mmHg  Pulse 65  Ht 5' 8.5" (1.74 m)  Wt 72.394 kg (159 lb 9.6 oz)  BMI 23.91 kg/m2 , BMI Body mass index is 23.91 kg/(m^2). GEN: Well nourished, well developed, in no acute distress HEENT: normal Neck: no JVD, carotid bruits, or masses Cardiac: RRR; no murmurs, rubs, or gallops,no edema  Respiratory:  clear to auscultation bilaterally, normal work of breathing GI: soft, nontender, nondistended, + BS MS: no deformity or atrophy Skin: warm and dry, no rash Neuro:  Strength and sensation are intact Psych: normal   EKG:  EKG is ordered today. The ekg ordered today demonstrates normal sinus rhythm at a rate of 87. He has no ST or T wave changes.   Recent Labs: No results found for requested labs within last 365 days.    Lipid Panel No results found for: CHOL, TRIG, HDL, CHOLHDL, VLDL, LDLCALC, LDLDIRECT    Wt Readings from Last 3 Encounters:  02/08/15 72.394 kg (159 lb 9.6 oz)  11/24/14 70.308 kg (155 lb)  11/07/14 70.308 kg (155 lb)      Other studies Reviewed: Additional studies/ records that were reviewed today include: . Review of the above records demonstrates:    ASSESSMENT AND  PLAN:  1.  Paroxysmal atrial fibrillation- can presents with episodes of paroxysmal atrial fibrillation. He's had 2 episodes. One lasted for 3 hours and the other lasted for about a day. His HADS2 VASC score is 1 ( age) .  We discussed starting him on anti-coagulation. He still having some issues with colitis that he would see would like to hold off on starting any anticoagulations at this time.  He has not had any additional episodes of atrial fib over the past 3 months . Will hold off on starting any anticoagulants at this time.  He is still having issues with his colitis . Echo  Showed  normal  Normal LV function , mild MR.    I will see him in 1 year .  2. Chest discomfort - he's had some episodes of chest discomfort. myoview was normal   I'll see him again in 3 months for follow-up visit.   Current medicines are reviewed at length with the patient today.  The patient does not have concerns regarding medicines.  The following changes have been made:  no change   Disposition:   FU with me in 1 year .     Signed, Haralambos Yeatts, Wonda Cheng, MD  02/08/2015 4:40 PM    [facility] [place], [place], [place]  [address] Phone: [phone]; Fax: [phone]

## 2015-02-08 NOTE — Patient Instructions (Signed)
Medication Instructions:  Your physician recommends that you continue on your current medications as directed. Please refer to the Current Medication list given to you today.   Labwork: None Ordered   Testing/Procedures: None Ordered   Follow-Up: Your physician wants you to follow-up in: 1 year with Dr. Nahser.  You will receive a reminder letter in the mail two months in advance. If you don't receive a letter, please call our office to schedule the follow-up appointment.      

## 2015-03-01 ENCOUNTER — Telehealth: Payer: Self-pay | Admitting: Cardiovascular Disease

## 2015-03-01 ENCOUNTER — Other Ambulatory Visit (INDEPENDENT_AMBULATORY_CARE_PROVIDER_SITE_OTHER): Payer: Medicare Other | Admitting: *Deleted

## 2015-03-01 ENCOUNTER — Encounter: Payer: Self-pay | Admitting: Nurse Practitioner

## 2015-03-01 DIAGNOSIS — I480 Paroxysmal atrial fibrillation: Secondary | ICD-10-CM | POA: Diagnosis not present

## 2015-03-01 LAB — CBC WITH DIFFERENTIAL/PLATELET
Basophils Absolute: 0 10*3/uL (ref 0.0–0.1)
Basophils Relative: 0.3 % (ref 0.0–3.0)
Eosinophils Absolute: 0 10*3/uL (ref 0.0–0.7)
Eosinophils Relative: 0.3 % (ref 0.0–5.0)
HCT: 48.3 % (ref 39.0–52.0)
Hemoglobin: 16.3 g/dL (ref 13.0–17.0)
Lymphocytes Relative: 11.3 % — ABNORMAL LOW (ref 12.0–46.0)
Lymphs Abs: 1.5 10*3/uL (ref 0.7–4.0)
MCHC: 33.7 g/dL (ref 30.0–36.0)
MCV: 96.7 fl (ref 78.0–100.0)
Monocytes Absolute: 0.6 10*3/uL (ref 0.1–1.0)
Monocytes Relative: 4.5 % (ref 3.0–12.0)
Neutro Abs: 11.1 10*3/uL — ABNORMAL HIGH (ref 1.4–7.7)
Neutrophils Relative %: 83.6 % — ABNORMAL HIGH (ref 43.0–77.0)
Platelets: 223 10*3/uL (ref 150.0–400.0)
RBC: 4.99 Mil/uL (ref 4.22–5.81)
RDW: 13.7 % (ref 11.5–15.5)
WBC: 13.3 10*3/uL — ABNORMAL HIGH (ref 4.0–10.5)

## 2015-03-01 LAB — BASIC METABOLIC PANEL
BUN: 25 mg/dL — ABNORMAL HIGH (ref 6–23)
CO2: 30 mEq/L (ref 19–32)
Calcium: 9 mg/dL (ref 8.4–10.5)
Chloride: 101 mEq/L (ref 96–112)
Creatinine, Ser: 1.27 mg/dL (ref 0.40–1.50)
GFR: 59.54 mL/min — ABNORMAL LOW (ref >60.00)
Glucose, Bld: 137 mg/dL — ABNORMAL HIGH (ref 70–99)
Potassium: 4.2 mEq/L (ref 3.5–5.1)
Sodium: 136 mEq/L (ref 135–145)

## 2015-03-01 LAB — PROTIME-INR
INR: 1 ratio (ref 0.8–1.0)
Prothrombin Time: 10.9 s (ref 9.6–13.1)

## 2015-03-01 NOTE — Telephone Encounter (Signed)
New Message       Pt calling stating that he went into A-fib last night and wants to know if he needs to come in to get an EKG and get cardioverted. Pt is upset because he states he contacted the doctor on call at 7am and no one got back with him. Please call back and advise.

## 2015-03-01 NOTE — Telephone Encounter (Signed)
Spoke with patient who states he went into atrial fib at approximately 7 or 8 pm last night.  States this is his 3rd episode.    Patient states he took Diltiazem 30 mg at 2100 yesterday and 30 mg this morning; states he has not converted yet and was wondering if Dr. Acie Fredrickson would want to cardiovert him. I advised him that I will need to discuss with Dr. Acie Fredrickson since he has not been on anti-coagulation. States has not had breakfast this morning. Patient states he converted spontaneously from the 1st episode and converted after about 20 hours after taking Diltiazem the 2nd episode.  Patient denies dizziness, light headedness or SOB; states he feels occasional forceful contraction.  I advised him that I will send message to Dr. Acie Fredrickson who is in the hospital today and will call him back with his advice.  Patient verbalized understanding and agreement with plan.

## 2015-03-01 NOTE — Telephone Encounter (Signed)
Lets start him on Eliquis today and set him up for a TEE / Cardioversion for tomorrow.  Will have received 3 doses of Eliquis.      Thanks so much

## 2015-03-01 NOTE — Telephone Encounter (Signed)
Patient came in for pre-procedure lab work and I reviewed TEE/DCCV instructions with him.  He is scheduled for procedure tomorrow at 2:00 with Dr. Acie Fredrickson.  I gave him Eliquis 5 mg samples to start today. I advised him that I will send Eliquis Rx after procedure tomorrow as Dr. Acie Fredrickson would like him to continue on the medication at least 1 month after cardioversion.   I palpated his pulse and he is still in atrial fib. He advised that he will call the office if he converts out of a fib or if he develops worsening symptoms today.  Patient ambulated out of the office in no acute distress.

## 2015-03-01 NOTE — Telephone Encounter (Signed)
New message      Pt was in afib this morning, back in normal rhythm now.  Pt thinks he is due for a procedure in the morning.  Please advise

## 2015-03-01 NOTE — Telephone Encounter (Signed)
Follow up     Pt calling again, wondering when someone will call him back today.  Please advise

## 2015-03-01 NOTE — Telephone Encounter (Signed)
Patient st he has returned to NSR and is not sure if DCCV is necessary tomorrow.  Informed patient that an EKG is done at the hospital and if patient is in NSR, DCCV will not be completed.  He is requesting a call back from [place], [place] in the morning to discuss his treatment plan.

## 2015-03-02 ENCOUNTER — Ambulatory Visit (HOSPITAL_COMMUNITY): Admission: RE | Admit: 2015-03-02 | Payer: Medicare Other | Source: Ambulatory Visit | Admitting: Cardiovascular Disease

## 2015-03-02 ENCOUNTER — Encounter (HOSPITAL_COMMUNITY): Admission: RE | Payer: Self-pay | Source: Ambulatory Visit

## 2015-03-02 SURGERY — ECHOCARDIOGRAM, TRANSESOPHAGEAL
Anesthesia: Monitor Anesthesia Care

## 2015-03-02 MED ORDER — DILTIAZEM HCL ER COATED BEADS 120 MG PO CP24: 120.0000 mg | ORAL_CAPSULE | Freq: Every day | ORAL | Status: DC

## 2015-03-02 MED ORDER — APIXABAN 5 MG PO TABS: 5.0000 mg | ORAL_TABLET | Freq: Two times a day (BID) | ORAL | Status: DC

## 2015-03-02 MED ORDER — PROPRANOLOL HCL 10 MG PO TABS: 10.0000 mg | ORAL_TABLET | Freq: Three times a day (TID) | ORAL | Status: DC

## 2015-03-02 NOTE — Telephone Encounter (Signed)
Agree with note by Michelle Swinyer, RN  

## 2015-03-02 NOTE — Telephone Encounter (Signed)
Called patient who states he converted to NSR yesterday at approximately 1400; he is scheduled for TEE/DCCV today with Dr. Acie Fredrickson. 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  He denies symptoms of fatigue, dizziness, light-headedness, or SOB.  He states he has not monitored his BP with a cuff but he does not feel that he is hypotensive.  I advised him that we would like to confirm NSR with an ekg in the office, however [place] away from the office.  He is willing to go to an urgent care in the area for an ekg.  I spoke with Dr. Acie Fredrickson, who is in the hospital, by telephone and he advised the following plan:   He does not have to confirm EKG at a local provider; Dr. Acie Fredrickson trusts his clinical judgment Continue Eliquis 5 mg BID for 30 days; will monitor number of episodes of atrial fib and then make a decision regarding need for NOAC Stop Diltiazem 30 mg QID (this was started by Dr. Felipa Eth prior to 1st appointment with Dr. Acie Fredrickson, pt stopped before he saw Dr. Acie Fredrickson then restarted this week)  Start Diltiazem 120 mg once daily Start Inderal 10 mg QID prn for break through atrial fib Patient verbalized understanding and agreement with plan of care.  I advised him to call back with questions or concerns.

## 2015-04-02 ENCOUNTER — Telehealth: Payer: Self-pay | Admitting: Cardiovascular Disease

## 2015-04-02 NOTE — Telephone Encounter (Signed)
Left message for patient to call back tomorrow morning after 10 am

## 2015-04-02 NOTE — Telephone Encounter (Signed)
New message    Pt is taking diltiazem 146m 2 times a day and apixaban (eliquis) 533m2 times a day Pt states since going on these medications he has had no Afib. Please call to discuss

## 2015-04-03 DIAGNOSIS — Z79899 Other long term (current) drug therapy: Secondary | ICD-10-CM | POA: Diagnosis not present

## 2015-04-03 DIAGNOSIS — Z0000 Encounter for general adult medical examination without abnormal findings: Secondary | ICD-10-CM | POA: Diagnosis not present

## 2015-04-03 DIAGNOSIS — H9190 Unspecified hearing loss, unspecified ear: Secondary | ICD-10-CM | POA: Diagnosis not present

## 2015-04-03 DIAGNOSIS — E780 Pure hypercholesterolemia: Secondary | ICD-10-CM | POA: Diagnosis not present

## 2015-04-03 DIAGNOSIS — C61 Malignant neoplasm of prostate: Secondary | ICD-10-CM | POA: Diagnosis not present

## 2015-04-03 DIAGNOSIS — Z1389 Encounter for screening for other disorder: Secondary | ICD-10-CM | POA: Diagnosis not present

## 2015-04-03 DIAGNOSIS — I480 Paroxysmal atrial fibrillation: Secondary | ICD-10-CM | POA: Diagnosis not present

## 2015-04-03 NOTE — Telephone Encounter (Signed)
Spoke with patient who called to discuss medications for atrial fib.  States he is wondering if Dr. Acie Fredrickson wants him to continue both Diltiazem and Eliquis; states he has not been in atrial fib in the last month and is traveling to [place] for a few days.  I advised him that I discussed his therapy yesterday with Dr. Acie Fredrickson who was in the office at the time.  Per Dr. Acie Fredrickson, he may stop Eliquis and continue Diltiazem; patient should keep Eliquis on hand and resume if he goes into atrial fib.  He is in agreement with plan and is aware he should notify us if this occurs.  He requested a follow-up appointment for September and he is scheduled for 9/7.  The patient thanked me for the call.

## 2015-04-03 NOTE — Telephone Encounter (Signed)
I have discussed the issue with Christen Bame, RN and agree with the plan

## 2015-04-25 DIAGNOSIS — B353 Tinea pedis: Secondary | ICD-10-CM | POA: Diagnosis not present

## 2015-04-25 DIAGNOSIS — K5150 Left sided colitis without complications: Secondary | ICD-10-CM | POA: Diagnosis not present

## 2015-04-25 DIAGNOSIS — B351 Tinea unguium: Secondary | ICD-10-CM | POA: Diagnosis not present

## 2015-04-25 DIAGNOSIS — K589 Irritable bowel syndrome without diarrhea: Secondary | ICD-10-CM | POA: Diagnosis not present

## 2015-04-25 DIAGNOSIS — L820 Inflamed seborrheic keratosis: Secondary | ICD-10-CM | POA: Diagnosis not present

## 2015-05-16 ENCOUNTER — Encounter: Payer: Self-pay | Admitting: Cardiovascular Disease

## 2015-05-16 ENCOUNTER — Ambulatory Visit (INDEPENDENT_AMBULATORY_CARE_PROVIDER_SITE_OTHER): Payer: Medicare Other | Admitting: Cardiovascular Disease

## 2015-05-16 VITALS — BP 132/86 | HR 64 | Ht 68.25 in | Wt 161.8 lb

## 2015-05-16 DIAGNOSIS — I480 Paroxysmal atrial fibrillation: Secondary | ICD-10-CM | POA: Diagnosis not present

## 2015-05-16 NOTE — Patient Instructions (Signed)
Medication Instructions:  Your physician recommends that you continue on your current medications as directed. Please refer to the Current Medication list given to you today.   Labwork: None Ordered   Testing/Procedures: None Ordered   Follow-Up: Your physician wants you to follow-up in: 6 months with Dr. Acie Fredrickson.  You will receive a reminder letter in the mail two months in advance. If you don't receive a letter, please call our office to schedule the follow-up appointment.

## 2015-05-16 NOTE — Progress Notes (Signed)
Cardiology Office Note   Date:  05/16/2015   ID:  Daniel Payne  Daniel DASCHEL ROUGHTON, DOB [birth year], MRN [id number]  PCP:  Mathews Argyle, MD  Cardiologist:   Thayer Headings, MD   Chief Complaint  Patient presents with  . Atrial Fibrillation   1. Problem List:  1. Paroxysmal Atrial fib 2. Hyperlipidemia 3. Prostate surgery-status post robotic prostatectomy 4. Ulcerative colitis   History of Present Illness: Daniel Payne  Daniel Payne is a 69 y.o. 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  Has not fully controlled the bleeding. Best controlled by steroids.    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 He was cycling and went into rapid Afib with rate of 170.  He converted back to NSR in 3-4 hours. Had another episode of Afib while working in his garden.  Went to Daniel.  Carlyle Lipa office . Afib was documeneted.  Was started on Dilt 30 QID.  Converted to NSR the next day He stopped the Dilt a week or so later.  Has developed episodes of sinus tachycardia recently.  Also associated with some chest tightness.  Does not last long.   This am he had very slight chest tightness while he was walking.  Lasted 5  Minutes.  No diaphoresis, no radiation.  Drinks wine with dinner.  02/08/2015:  Skin is doing well. He has paroxysmal atrial fibrillation. He has not wanted to start anticoagulation yet. He's had some problems with colitis. He had a stress Myoview study as part of his workup for some vague chest tightness. Myoview revealed no evidence of ischemia. He had normal left ventricle systolic function with an ejection fraction of 75%. Has been started on Humira for Ulcerative colits . Has been cycling quite a bit.  No issues.   No recurrent episodes of atrial fib .  He thinks it may have been related to the high dose prednisone   Sept. 7, 2016:  Daniel Payne is doing  well. He stopped his Eliquis  Doing well.  Cycling regularly .  Past Medical History  Diagnosis Date  . Low back pain   . Hyperlipidemia   . Fissure, anal   . ADHD (attention deficit hyperactivity disorder)   . Giardia     working in [place]  . Infectious mononucleosis   . Cancer     prostate Daniel. Lawerance Bach  . Spinal stenosis of lumbar region   . Chronic kidney disease     borderline stage III  . Presbycusis of left ear   . Ulcerative colitis   . A-fib     Past Surgical History  Procedure Laterality Date  . Prostate surgery    . Anal fissure repair    . Varicocele excision Right   . Appendectomy    . Rotator cuff repair Left      Current Outpatient Prescriptions  Medication Sig Dispense Refill  . acetaminophen (TYLENOL) 500 MG tablet Take 500 mg by mouth every 6 (six) hours as needed.    Marland Kitchen aspirin 81 MG tablet Take 81 mg by mouth daily.    Marland Kitchen atorvastatin (LIPITOR) 10 MG tablet Take 10 mg by mouth daily.  11  . diltiazem (CARDIZEM CD) 120 MG 24 hr capsule Take 1 capsule (120 mg total) by mouth daily. 31 capsule 11  . diphenhydrAMINE (BENADRYL) 25 MG tablet Take 25 mg  by mouth every 6 (six) hours as needed.    Marland Kitchen HUMIRA PEN 40 MG/0.8ML PNKT Inject as directed. EVERY 2 WEEKS  11  . terbinafine (LAMISIL) 250 MG tablet Take 250 mg by mouth daily.  0   No current facility-administered medications for this visit.    Allergies:   Concerta; Hydrocortisone; and Lialda    Social History:  The patient  reports that he has never smoked. He does not have any smokeless tobacco history on file. He reports that he drinks alcohol. He reports that he does not use illicit drugs.   Family History:  The patient's family history includes Heart attack in his father; Stroke (age of onset: 64) in his mother.    ROS:  Please see the history of present illness.    Review of Systems: Constitutional:  denies fever, chills, diaphoresis, appetite change and fatigue.  HEENT: denies  photophobia, eye pain, redness, hearing loss, ear pain, congestion, sore throat, rhinorrhea, sneezing, neck pain, neck stiffness and tinnitus.  Respiratory: denies SOB, DOE, cough, chest tightness, and wheezing.  Cardiovascular: admits to chest pain, palpitations    Gastrointestinal: denies nausea, vomiting, abdominal pain, diarrhea, constipation, blood in stool.  Genitourinary: denies dysuria, urgency, frequency, hematuria, flank pain and difficulty urinating.  Musculoskeletal: denies  myalgias, back pain, joint swelling, arthralgias and gait problem.   Skin: denies pallor, rash and wound.  Neurological: denies dizziness, seizures, syncope, weakness, light-headedness, numbness and headaches.   Hematological: denies adenopathy, easy bruising, personal or family bleeding history.  Psychiatric/ Behavioral: denies suicidal ideation, mood changes, confusion, nervousness, sleep disturbance and agitation.       All other systems are reviewed and negative.    PHYSICAL EXAM: VS:  BP 132/86 mmHg  Pulse 64  Ht 5' 8.25" (1.734 m)  Wt 73.392 kg (161 lb 12.8 oz)  BMI 24.41 kg/m2 , BMI Body mass index is 24.41 kg/(m^2). GEN: Well nourished, well developed, in no acute distress HEENT: normal Neck: no JVD, carotid bruits, or masses Cardiac: RR; no murmurs, rubs, or gallops,no edema  Respiratory:  clear to auscultation bilaterally, normal work of breathing GI: soft, nontender, nondistended, + BS MS: no deformity or atrophy Skin: warm and dry, no rash Neuro:  Strength and sensation are intact Psych: normal   EKG:  EKG is ordered today. The ekg ordered today demonstrates normal sinus rhythm at a rate of 64.    He has no ST or T wave changes.   Recent Labs: 03/01/2015: BUN 25*; Creatinine, Ser 1.27; Hemoglobin 16.3; Platelets 223.0; Potassium 4.2; Sodium 136    Lipid Panel No results found for: CHOL, TRIG, HDL, CHOLHDL, VLDL, LDLCALC, LDLDIRECT    Wt Readings from Last 3 Encounters:    05/16/15 73.392 kg (161 lb 12.8 oz)  02/08/15 72.394 kg (159 lb 9.6 oz)  11/24/14 70.308 kg (155 lb)      Other studies Reviewed: Additional studies/ records that were reviewed today include: . Review of the above records demonstrates:    ASSESSMENT AND PLAN:  1.  Paroxysmal atrial fibrillation- can presents with episodes of paroxysmal atrial fibrillation. He's had 2 episodes. One lasted for 3 hours and the other lasted for about a day. His HADS2 VASC score is 1 ( age) .  Marland Kitchen  He has Eliquis and will start it if he goes back into atrial fib.  He has maintained NSR on diltiazem   2. Chest discomfort - he's had some episodes of chest discomfort. myoview was normal   I'll  see him again in 6 months for follow-up visit.   Current medicines are reviewed at length with the patient today.  The patient does not have concerns regarding medicines.  The following changes have been made:  no change   Disposition:   FU with me in 6 months    Signed, Dolce Sylvia, Wonda Cheng, MD  05/16/2015 8:47 AM    [facility] [place], [place], [place]  [address] Phone: [phone]; Fax: [phone]

## 2015-05-29 DIAGNOSIS — L608 Other nail disorders: Secondary | ICD-10-CM | POA: Diagnosis not present

## 2015-05-31 DIAGNOSIS — Z79899 Other long term (current) drug therapy: Secondary | ICD-10-CM | POA: Diagnosis not present

## 2015-06-19 DIAGNOSIS — Z23 Encounter for immunization: Secondary | ICD-10-CM | POA: Diagnosis not present

## 2015-06-21 DIAGNOSIS — M25562 Pain in left knee: Secondary | ICD-10-CM | POA: Diagnosis not present

## 2015-06-21 DIAGNOSIS — M25462 Effusion, left knee: Secondary | ICD-10-CM | POA: Diagnosis not present

## 2015-06-26 DIAGNOSIS — H903 Sensorineural hearing loss, bilateral: Secondary | ICD-10-CM | POA: Diagnosis not present

## 2015-10-23 DIAGNOSIS — R21 Rash and other nonspecific skin eruption: Secondary | ICD-10-CM | POA: Diagnosis not present

## 2015-10-23 DIAGNOSIS — K5190 Ulcerative colitis, unspecified, without complications: Secondary | ICD-10-CM | POA: Diagnosis not present

## 2015-11-06 ENCOUNTER — Ambulatory Visit: Payer: Medicare Other | Admitting: Cardiovascular Disease

## 2015-11-14 ENCOUNTER — Ambulatory Visit (INDEPENDENT_AMBULATORY_CARE_PROVIDER_SITE_OTHER): Payer: Medicare Other | Admitting: Cardiovascular Disease

## 2015-11-14 ENCOUNTER — Encounter: Payer: Self-pay | Admitting: Cardiovascular Disease

## 2015-11-14 VITALS — BP 116/62 | HR 80 | Ht 68.25 in | Wt 160.5 lb

## 2015-11-14 DIAGNOSIS — I480 Paroxysmal atrial fibrillation: Secondary | ICD-10-CM

## 2015-11-14 NOTE — Progress Notes (Signed)
Cardiology Office Note   Date:  11/14/2015   ID:  [name]  DR BRYLON BRENNING, DOB 1946-08-25, MRN [id number]  PCP:  Mathews Argyle, MD  Cardiologist:   Thayer Headings, MD   Chief Complaint  Patient presents with  . Follow-up    atrial fib   1. Problem List:  1. Paroxysmal Atrial fib 2. Hyperlipidemia 3. Prostate surgery-status post robotic prostatectomy 4. Ulcerative colitis   History of Present Illness: Jp  DR SHAHEED SCHMUCK is a 70 y.o. 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  Has not fully controlled the bleeding. Best controlled by steroids.    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 He was cycling and went into rapid Afib with rate of 170.  He converted back to NSR in 3-4 hours. Had another episode of Afib while working in his garden.  Went to Dr.  Carlyle Lipa office . Afib was documeneted.  Was started on Dilt 30 QID.  Converted to NSR the next day He stopped the Dilt a week or so later.  Has developed episodes of sinus tachycardia recently.  Also associated with some chest tightness.  Does not last long.   This am he had very slight chest tightness while he was walking.  Lasted 5  Minutes.  No diaphoresis, no radiation.  Drinks wine with dinner.  02/08/2015:  Skin is doing well. He has paroxysmal atrial fibrillation. He has not wanted to start anticoagulation yet. He's had some problems with colitis. He had a stress Myoview study as part of his workup for some vague chest tightness. Myoview revealed no evidence of ischemia. He had normal left ventricle systolic function with an ejection fraction of 75%. Has been started on Humira for Ulcerative colits . Has been cycling quite a bit.  No issues.   No recurrent episodes of atrial fib .  He thinks it may have been related to the high dose prednisone   Sept. 7, 2016:  [name] is  doing well. He stopped his Eliquis  Doing well.  Cycling regularly .  November 14, 2015:   Doing well.  Still cycling .    Just got back from [facility] for snow skiing .  [name], [place], )  Very active .  His UC is well controlled  Past Medical History  Diagnosis Date  . Low back pain   . Hyperlipidemia   . Fissure, anal   . ADHD (attention deficit hyperactivity disorder)   . Giardia     working in [place]  . Infectious mononucleosis   . Cancer [facility])     prostate dr. Chriss Czar davis  . Spinal stenosis of lumbar region   . Chronic kidney disease     borderline stage III  . Presbycusis of left ear   . Ulcerative colitis ([place])   . A-fib [facility])     Past Surgical History  Procedure Laterality Date  . Prostate surgery    . Anal fissure repair    . Varicocele excision Right   . Appendectomy    . Rotator cuff repair Left      Current Outpatient Prescriptions  Medication Sig Dispense Refill  . acetaminophen (TYLENOL) 500 MG tablet Take 500 mg by mouth every 6 (six) hours as needed.    Marland Kitchen aspirin 81 MG tablet Take 81 mg by mouth daily.    Marland Kitchen  atorvastatin (LIPITOR) 10 MG tablet Take 10 mg by mouth daily.  11  . diltiazem (CARDIZEM CD) 120 MG 24 hr capsule Take 1 capsule (120 mg total) by mouth daily. 31 capsule 11  . diphenhydrAMINE (BENADRYL) 25 MG tablet Take 25 mg by mouth every 6 (six) hours as needed.    Marland Kitchen HUMIRA PEN 40 MG/0.8ML PNKT Inject as directed. EVERY 2 WEEKS  11   No current facility-administered medications for this visit.    Allergies:   Concerta; Hydrocortisone; and Lialda    Social History:  The patient  reports that he has never smoked. He does not have any smokeless tobacco history on file. He reports that he drinks alcohol. He reports that he does not use illicit drugs.   Family History:  The patient's family history includes Heart attack in his father; Stroke (age of onset: 26) in his mother.    ROS:  Please see the history of present illness.     Review of Systems: Constitutional:  denies fever, chills, diaphoresis, appetite change and fatigue.  HEENT: denies photophobia, eye pain, redness, hearing loss, ear pain, congestion, sore throat, rhinorrhea, sneezing, neck pain, neck stiffness and tinnitus.  Respiratory: denies SOB, DOE, cough, chest tightness, and wheezing.  Cardiovascular: admits to chest pain, palpitations    Gastrointestinal: denies nausea, vomiting, abdominal pain, diarrhea, constipation, blood in stool.  Genitourinary: denies dysuria, urgency, frequency, hematuria, flank pain and difficulty urinating.  Musculoskeletal: denies  myalgias, back pain, joint swelling, arthralgias and gait problem.   Skin: denies pallor, rash and wound.  Neurological: denies dizziness, seizures, syncope, weakness, light-headedness, numbness and headaches.   Hematological: denies adenopathy, easy bruising, personal or family bleeding history.  Psychiatric/ Behavioral: denies suicidal ideation, mood changes, confusion, nervousness, sleep disturbance and agitation.       All other systems are reviewed and negative.    PHYSICAL EXAM: VS:  BP 116/62 mmHg  Pulse 80  Ht 5' 8.25" (1.734 m)  Wt 160 lb 8 oz (72.802 kg)  BMI 24.21 kg/m2 , BMI Body mass index is 24.21 kg/(m^2). GEN: Well nourished, well developed, in no acute distress HEENT: normal Neck: no JVD, carotid bruits, or masses Cardiac: RR; no murmurs, rubs, or gallops,no edema  Respiratory:  clear to auscultation bilaterally, normal work of breathing GI: soft, nontender, nondistended, + BS MS: no deformity or atrophy Skin: warm and dry, no rash Neuro:  Strength and sensation are intact Psych: normal   EKG:  EKG is not ordered today.   Recent Labs: 03/01/2015: BUN 25*; Creatinine, Ser 1.27; Hemoglobin 16.3; Platelets 223.0; Potassium 4.2; Sodium 136    Lipid Panel No results found for: CHOL, TRIG, HDL, CHOLHDL, VLDL, LDLCALC, LDLDIRECT    Wt Readings from Last 3  Encounters:  11/14/15 160 lb 8 oz (72.802 kg)  05/16/15 161 lb 12.8 oz (73.392 kg)  02/08/15 159 lb 9.6 oz (72.394 kg)      Other studies Reviewed: Additional studies/ records that were reviewed today include: . Review of the above records demonstrates:    ASSESSMENT AND PLAN:  1.  Paroxysmal atrial fibrillation- can presents with episodes of paroxysmal atrial fibrillation.   His HADS2 VASC score is 1 ( age) .  Marland Kitchen  He has Eliquis and will start it if he goes back into atrial fib.  He has maintained NSR on diltiazem.  2. Chest discomfort - he's had some episodes of chest discomfort. myoview was normal   I'll see him again in 6 months for follow-up  visit.  Current medicines are reviewed at length with the patient today.  The patient does not have concerns regarding medicines.  The following changes have been made:  no change  Disposition:   FU with me in 6 months    Signed, Nahser, Wonda Cheng, MD  11/14/2015 8:00 AM    [facility] [place], [place], [place]  [address] Phone: [phone]; Fax: [phone]

## 2015-11-14 NOTE — Patient Instructions (Signed)
Medication Instructions:  Your physician recommends that you continue on your current medications as directed. Please refer to the Current Medication list given to you today.   Labwork: None Ordered   Testing/Procedures: None Ordered   Follow-Up: Your physician wants you to follow-up in: 1 year with Dr. Nahser.  You will receive a reminder letter in the mail two months in advance. If you don't receive a letter, please call our office to schedule the follow-up appointment.   If you need a refill on your cardiac medications before your next appointment, please call your pharmacy.   Thank you for choosing [facility]! Artelia Game, RN [phone]    

## 2015-12-18 ENCOUNTER — Other Ambulatory Visit: Payer: Self-pay | Admitting: *Deleted

## 2015-12-18 ENCOUNTER — Telehealth: Payer: Self-pay | Admitting: Cardiovascular Disease

## 2015-12-18 MED ORDER — DILTIAZEM HCL ER COATED BEADS 120 MG PO CP24: 120.0000 mg | ORAL_CAPSULE | Freq: Every day | ORAL | Status: DC

## 2015-12-18 NOTE — Telephone Encounter (Signed)
°*  STAT* If patient is at the pharmacy, call can be transferred to refill team.   1. Which medications need to be refilled? (please list name of each medication and dose if known)Diltiazem XR 120 mg- [place],prescription is not due until 01-04-16  2. Which pharmacy/location (including street and city if local pharmacy) is medication to be sent to?[facility]-[phone].Do they need a 30 day or 90 day supply? 90 and refills

## 2016-01-16 DIAGNOSIS — L298 Other pruritus: Secondary | ICD-10-CM | POA: Diagnosis not present

## 2016-01-16 DIAGNOSIS — D2261 Melanocytic nevi of right upper limb, including shoulder: Secondary | ICD-10-CM | POA: Diagnosis not present

## 2016-01-16 DIAGNOSIS — B351 Tinea unguium: Secondary | ICD-10-CM | POA: Diagnosis not present

## 2016-01-16 DIAGNOSIS — D225 Melanocytic nevi of trunk: Secondary | ICD-10-CM | POA: Diagnosis not present

## 2016-01-16 DIAGNOSIS — D1801 Hemangioma of skin and subcutaneous tissue: Secondary | ICD-10-CM | POA: Diagnosis not present

## 2016-01-16 DIAGNOSIS — L821 Other seborrheic keratosis: Secondary | ICD-10-CM | POA: Diagnosis not present

## 2016-01-16 DIAGNOSIS — D2271 Melanocytic nevi of right lower limb, including hip: Secondary | ICD-10-CM | POA: Diagnosis not present

## 2016-02-13 DIAGNOSIS — H2513 Age-related nuclear cataract, bilateral: Secondary | ICD-10-CM | POA: Diagnosis not present

## 2016-02-13 DIAGNOSIS — H4423 Degenerative myopia, bilateral: Secondary | ICD-10-CM | POA: Diagnosis not present

## 2016-02-13 DIAGNOSIS — H43393 Other vitreous opacities, bilateral: Secondary | ICD-10-CM | POA: Diagnosis not present

## 2016-04-09 DIAGNOSIS — Z1389 Encounter for screening for other disorder: Secondary | ICD-10-CM | POA: Diagnosis not present

## 2016-04-09 DIAGNOSIS — Z79899 Other long term (current) drug therapy: Secondary | ICD-10-CM | POA: Diagnosis not present

## 2016-04-09 DIAGNOSIS — E7800 Pure hypercholesterolemia, unspecified: Secondary | ICD-10-CM | POA: Diagnosis not present

## 2016-04-09 DIAGNOSIS — I480 Paroxysmal atrial fibrillation: Secondary | ICD-10-CM | POA: Diagnosis not present

## 2016-04-09 DIAGNOSIS — K5150 Left sided colitis without complications: Secondary | ICD-10-CM | POA: Diagnosis not present

## 2016-04-09 DIAGNOSIS — C61 Malignant neoplasm of prostate: Secondary | ICD-10-CM | POA: Diagnosis not present

## 2016-04-09 DIAGNOSIS — Z0000 Encounter for general adult medical examination without abnormal findings: Secondary | ICD-10-CM | POA: Diagnosis not present

## 2016-05-07 DIAGNOSIS — Z23 Encounter for immunization: Secondary | ICD-10-CM | POA: Diagnosis not present

## 2016-06-06 ENCOUNTER — Other Ambulatory Visit: Payer: Self-pay | Admitting: Cardiovascular Disease

## 2016-11-05 DIAGNOSIS — K5150 Left sided colitis without complications: Secondary | ICD-10-CM | POA: Diagnosis not present

## 2016-11-19 ENCOUNTER — Ambulatory Visit: Payer: Medicare Other | Admitting: Cardiovascular Disease

## 2016-11-19 ENCOUNTER — Encounter: Payer: Self-pay | Admitting: Cardiovascular Disease

## 2016-11-26 ENCOUNTER — Ambulatory Visit (INDEPENDENT_AMBULATORY_CARE_PROVIDER_SITE_OTHER): Payer: Medicare Other | Admitting: Cardiovascular Disease

## 2016-11-26 ENCOUNTER — Encounter: Payer: Self-pay | Admitting: Cardiovascular Disease

## 2016-11-26 VITALS — BP 122/74 | HR 72 | Ht 68.25 in | Wt 163.2 lb

## 2016-11-26 DIAGNOSIS — I480 Paroxysmal atrial fibrillation: Secondary | ICD-10-CM | POA: Diagnosis not present

## 2016-11-26 DIAGNOSIS — I482 Chronic atrial fibrillation, unspecified: Secondary | ICD-10-CM

## 2016-11-26 DIAGNOSIS — E782 Mixed hyperlipidemia: Secondary | ICD-10-CM

## 2016-11-26 NOTE — Patient Instructions (Signed)

## 2016-11-26 NOTE — Progress Notes (Signed)
Cardiology Office Note   Date:  11/26/2016   ID:  ELLISON RIETH, DOB 1946/08/18, MRN [id number]  PCP:  Mathews Argyle, MD  Cardiologist:   Mertie Moores, MD   Chief Complaint  Patient presents with  . Atrial Fibrillation   1. Problem List:  1. Paroxysmal Atrial fib 2. Hyperlipidemia 3. Prostate surgery-status post robotic prostatectomy 4. Ulcerative colitis   History of Present Illness: Daniel  DR ALQUAN Payne is a 71 y.o. 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  Has not fully controlled the bleeding. Best controlled by steroids.    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 He was cycling and went into rapid Afib with rate of 170.  He converted back to NSR in 3-4 hours. Had another episode of Afib while working in his garden.  Went to Dr.  Carlyle Lipa office . Afib was documeneted.  Was started on Dilt 30 QID.  Converted to NSR the next day He stopped the Dilt a week or so later.  Has developed episodes of sinus tachycardia recently.  Also associated with some chest tightness.  Does not last long.   This am he had very slight chest tightness while he was walking.  Lasted 5  Minutes.  No diaphoresis, no radiation.  Drinks wine with dinner.  02/08/2015:  Skin is doing well. He has paroxysmal atrial fibrillation. He has not wanted to start anticoagulation yet. He's had some problems with colitis. He had a stress Myoview study as part of his workup for some vague chest tightness. Myoview revealed no evidence of ischemia. He had normal left ventricle systolic function with an ejection fraction of 75%. Has been started on Humira for Ulcerative colits . Has been cycling quite a bit.  No issues.   No recurrent episodes of atrial fib .  He thinks it may have been related to the high dose prednisone   Sept. 7, 2016:  Daniel Payne is doing  well. He stopped his Eliquis  Doing well.  Cycling regularly .  November 14, 2015:   Doing well.  Still cycling .    Just got back from [facility] for snow skiing .  [name], [place], )  Very active .  His UC is well controlled  November 26, 2016  Daniel Payne is doing well Spent the past 8 days skiing our in [place] 20-25 days a year now  Does well even at high altitude. Also hiked last year in [place] without any problems   Past Medical History:  Diagnosis Date  . A-fib ([place])   . ADHD (attention deficit hyperactivity disorder)   . Cancer [facility])    prostate dr. Chriss Czar davis  . Chronic kidney disease    borderline stage III  . Fissure, anal   . Giardia    working in [place]  . Hyperlipidemia   . Infectious mononucleosis   . Low back pain   . Presbycusis of left ear   . Spinal stenosis of lumbar region   . Ulcerative colitis [facility])     Past Surgical History:  Procedure Laterality Date  . ANAL FISSURE REPAIR    . APPENDECTOMY    . PROSTATE SURGERY    . ROTATOR CUFF REPAIR Left   . VARICOCELE EXCISION Right      Current Outpatient Prescriptions  Medication Sig Dispense Refill  .  acetaminophen (TYLENOL) 500 MG tablet Take 500 mg by mouth every 6 (six) hours as needed.    Marland Kitchen aspirin 81 MG tablet Take 81 mg by mouth daily.    Marland Kitchen atorvastatin (LIPITOR) 10 MG tablet Take 10 mg by mouth daily.  11  . DILT-XR 120 MG 24 hr capsule TAKE 1 CAPSULE(120 MG) BY MOUTH DAILY 30 capsule 11  . diphenhydrAMINE (BENADRYL) 25 MG tablet Take 25 mg by mouth every 6 (six) hours as needed.    Marland Kitchen HUMIRA PEN 40 MG/0.8ML PNKT Inject as directed. EVERY 2 WEEKS  11   No current facility-administered medications for this visit.     Allergies:   Concerta [methylphenidate]; Hydrocortisone; and Lialda [mesalamine]    Social History:  The patient  reports that he has never smoked. He has never used smokeless tobacco. He reports that he drinks alcohol. He reports that he does not use drugs.   Family  History:  The patient's family history includes Heart attack in his father; Stroke (age of onset: 78) in his mother.    ROS:  Please see the history of present illness.    Review of Systems: Constitutional:  denies fever, chills, diaphoresis, appetite change and fatigue.  HEENT: denies photophobia, eye pain, redness, hearing loss, ear pain, congestion, sore throat, rhinorrhea, sneezing, neck pain, neck stiffness and tinnitus.  Respiratory: denies SOB, DOE, cough, chest tightness, and wheezing.  Cardiovascular: admits to chest pain, palpitations    Gastrointestinal: denies nausea, vomiting, abdominal pain, diarrhea, constipation, blood in stool.  Genitourinary: denies dysuria, urgency, frequency, hematuria, flank pain and difficulty urinating.  Musculoskeletal: denies  myalgias, back pain, joint swelling, arthralgias and gait problem.   Skin: denies pallor, rash and wound.  Neurological: denies dizziness, seizures, syncope, weakness, light-headedness, numbness and headaches.   Hematological: denies adenopathy, easy bruising, personal or family bleeding history.  Psychiatric/ Behavioral: denies suicidal ideation, mood changes, confusion, nervousness, sleep disturbance and agitation.       All other systems are reviewed and negative.    PHYSICAL EXAM: VS:  BP 122/74 (BP Location: Left Arm, Patient Position: Sitting, Cuff Size: Normal)   Pulse 72   Ht 5' 8.25" (1.734 m)   Wt 163 lb 3.2 oz (74 kg)   SpO2 96%   BMI 24.63 kg/m  , BMI Body mass index is 24.63 kg/m. GEN: Well nourished, well developed, in no acute distress  HEENT: normal  Neck: no JVD, carotid bruits, or masses Cardiac: RR; no murmurs, rubs, or gallops,no edema  Respiratory:  clear to auscultation bilaterally, normal work of breathing GI: soft, nontender, nondistended, + BS MS: no deformity or atrophy  Skin: warm and dry, no rash Neuro:  Strength and sensation are intact Psych: normal   EKG:  EKG is ordered  today. November 26, 2016:   NSR at 71.   Normal ECG   Recent Labs: No results found for requested labs within last 8760 hours.    Lipid Panel No results found for: CHOL, TRIG, HDL, CHOLHDL, VLDL, LDLCALC, LDLDIRECT    Wt Readings from Last 3 Encounters:  11/26/16 163 lb 3.2 oz (74 kg)  11/14/15 160 lb 8 oz (72.8 kg)  05/16/15 161 lb 12.8 oz (73.4 kg)      Other studies Reviewed: Additional studies/ records that were reviewed today include: . Review of the above records demonstrates:    ASSESSMENT AND PLAN:  1.  Paroxysmal atrial fibrillation- can presents with episodes of paroxysmal atrial fibrillation.   His HADS2 VASC  score is 1 ( age) .  Marland Kitchen  He has Eliquis and will start it if he goes back into atrial fib.  He has maintained NSR on diltiazem.  2. Chest discomfort - he's had some episodes of chest discomfort. myoview was normal   I'll see him again in 6 months for follow-up visit.  Current medicines are reviewed at length with the patient today.  The patient does not have concerns regarding medicines.  The following changes have been made:  no change  Disposition:   FU with me in 1 year    Signed, Mertie Moores, MD  11/26/2016 8:49 AM    [facility] [place], [place], [place]  [address] Phone: [phone]; Fax: [phone]

## 2017-03-17 DIAGNOSIS — C61 Malignant neoplasm of prostate: Secondary | ICD-10-CM | POA: Diagnosis not present

## 2017-03-17 DIAGNOSIS — I480 Paroxysmal atrial fibrillation: Secondary | ICD-10-CM | POA: Diagnosis not present

## 2017-03-17 DIAGNOSIS — Z8546 Personal history of malignant neoplasm of prostate: Secondary | ICD-10-CM | POA: Diagnosis not present

## 2017-04-08 DIAGNOSIS — L821 Other seborrheic keratosis: Secondary | ICD-10-CM | POA: Diagnosis not present

## 2017-04-08 DIAGNOSIS — D2271 Melanocytic nevi of right lower limb, including hip: Secondary | ICD-10-CM | POA: Diagnosis not present

## 2017-04-08 DIAGNOSIS — D2272 Melanocytic nevi of left lower limb, including hip: Secondary | ICD-10-CM | POA: Diagnosis not present

## 2017-04-08 DIAGNOSIS — D1801 Hemangioma of skin and subcutaneous tissue: Secondary | ICD-10-CM | POA: Diagnosis not present

## 2017-04-08 DIAGNOSIS — I788 Other diseases of capillaries: Secondary | ICD-10-CM | POA: Diagnosis not present

## 2017-04-08 DIAGNOSIS — D225 Melanocytic nevi of trunk: Secondary | ICD-10-CM | POA: Diagnosis not present

## 2017-04-08 DIAGNOSIS — L814 Other melanin hyperpigmentation: Secondary | ICD-10-CM | POA: Diagnosis not present

## 2017-04-14 DIAGNOSIS — E7800 Pure hypercholesterolemia, unspecified: Secondary | ICD-10-CM | POA: Diagnosis not present

## 2017-04-14 DIAGNOSIS — H9190 Unspecified hearing loss, unspecified ear: Secondary | ICD-10-CM | POA: Diagnosis not present

## 2017-04-14 DIAGNOSIS — Z8546 Personal history of malignant neoplasm of prostate: Secondary | ICD-10-CM | POA: Diagnosis not present

## 2017-04-14 DIAGNOSIS — Z125 Encounter for screening for malignant neoplasm of prostate: Secondary | ICD-10-CM | POA: Diagnosis not present

## 2017-04-14 DIAGNOSIS — K5190 Ulcerative colitis, unspecified, without complications: Secondary | ICD-10-CM | POA: Diagnosis not present

## 2017-04-14 DIAGNOSIS — I480 Paroxysmal atrial fibrillation: Secondary | ICD-10-CM | POA: Diagnosis not present

## 2017-04-14 DIAGNOSIS — Z1389 Encounter for screening for other disorder: Secondary | ICD-10-CM | POA: Diagnosis not present

## 2017-04-14 DIAGNOSIS — M549 Dorsalgia, unspecified: Secondary | ICD-10-CM | POA: Diagnosis not present

## 2017-04-14 DIAGNOSIS — Z79899 Other long term (current) drug therapy: Secondary | ICD-10-CM | POA: Diagnosis not present

## 2017-04-14 DIAGNOSIS — Z0000 Encounter for general adult medical examination without abnormal findings: Secondary | ICD-10-CM | POA: Diagnosis not present

## 2017-05-05 DIAGNOSIS — H903 Sensorineural hearing loss, bilateral: Secondary | ICD-10-CM | POA: Diagnosis not present

## 2017-05-19 ENCOUNTER — Other Ambulatory Visit: Payer: Self-pay | Admitting: Cardiovascular Disease

## 2017-05-20 ENCOUNTER — Telehealth: Payer: Self-pay | Admitting: Nurse Practitioner

## 2017-05-20 MED ORDER — DILTIAZEM HCL ER 120 MG PO CP24: 120.0000 mg | ORAL_CAPSULE | Freq: Every day | ORAL | 3 refills | Status: DC

## 2017-05-20 NOTE — Telephone Encounter (Signed)
Received call from patient that he is having difficulty getting his Diltiazem Rx from [place]. He requests #90 supply. I advised that I will order for him and advised him to call back with questions or concerns. He thanked me for the call.

## 2017-05-22 ENCOUNTER — Other Ambulatory Visit: Payer: Self-pay | Admitting: *Deleted

## 2017-05-22 MED ORDER — DILTIAZEM HCL ER 120 MG PO CP24: 120.0000 mg | ORAL_CAPSULE | Freq: Every day | ORAL | 3 refills | Status: DC

## 2017-05-22 NOTE — Telephone Encounter (Signed)
Patients wife called and stated that the patients diltiazem was sent to the wrong pharmacy. She stated that his insurance no longer allows [facility] to [facility] and he has called and cancelled the rx. I apologized for this and made her aware that I would [facility]. Wife was very [employer] attorney.

## 2017-06-03 DIAGNOSIS — Z23 Encounter for immunization: Secondary | ICD-10-CM | POA: Diagnosis not present

## 2017-06-08 ENCOUNTER — Encounter (INDEPENDENT_AMBULATORY_CARE_PROVIDER_SITE_OTHER): Payer: Self-pay | Admitting: Orthopaedic Surgery

## 2017-06-08 ENCOUNTER — Ambulatory Visit (INDEPENDENT_AMBULATORY_CARE_PROVIDER_SITE_OTHER): Payer: Medicare Other | Admitting: Orthopaedic Surgery

## 2017-06-08 ENCOUNTER — Ambulatory Visit (INDEPENDENT_AMBULATORY_CARE_PROVIDER_SITE_OTHER): Payer: Medicare Other

## 2017-06-08 VITALS — BP 106/66 | HR 14 | Resp 14 | Ht 68.5 in | Wt 175.0 lb

## 2017-06-08 DIAGNOSIS — M25562 Pain in left knee: Secondary | ICD-10-CM

## 2017-06-08 MED ORDER — LIDOCAINE HCL 1 % IJ SOLN
5.0000 mL | INTRAMUSCULAR | Status: AC | PRN
  Administered 2017-06-08: 5 mL

## 2017-06-08 MED ORDER — BUPIVACAINE HCL 0.5 % IJ SOLN
3.0000 mL | INTRAMUSCULAR | Status: AC | PRN
  Administered 2017-06-08: 3 mL via INTRA_ARTICULAR

## 2017-06-08 MED ORDER — METHYLPREDNISOLONE ACETATE 40 MG/ML IJ SUSP
80.0000 mg | INTRAMUSCULAR | Status: AC | PRN
  Administered 2017-06-08: 80 mg

## 2017-06-08 NOTE — Progress Notes (Signed)
Office Visit Note   Patient: Daniel Payne           Date of Birth: 1946/04/27           MRN: [id number] Visit Date: 06/08/2017              Requested by: Lajean Manes, MD [address] [place], [place] [address] PCP: Lajean Manes, MD   Assessment & Plan: Visit Diagnoses:  1. Left knee pain, unspecified chronicity     Plan: possible tear posterior horn lateral meniscus.cortisone injection lateral compartment left knee. Monitor results Follow-Up Instructions: No Follow-up on file.   Orders:  Orders Placed This Encounter  Procedures  . XR KNEE 3 VIEW LEFT   No orders of the defined types were placed in this encounter.     Procedures: Large Joint Inj Date/Time: 06/08/2017 2:39 PM Performed by: Garald Balding Authorized by: Garald Balding   Consent Given by:  Patient Timeout: prior to procedure the correct patient, procedure, and site was verified   Indications:  Pain and joint swelling Location:  Knee Site:  L knee Prep: patient was prepped and draped in usual sterile fashion   Needle Size:  25 G Needle Length:  1.5 inches Approach:  Anteromedial Ultrasound Guidance: No   Fluoroscopic Guidance: No   Arthrogram: No   Medications:  5 mL lidocaine 1 %; 80 mg methylPREDNISolone acetate 40 MG/ML; 3 mL bupivacaine 0.5 % Aspiration Attempted: No   Patient tolerance:  Patient tolerated the procedure well with no immediate complications     Clinical Data: No additional findings.   Subjective: Chief Complaint  Patient presents with  . 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  initial onset of pain while walking his dog. Having difficulty with twisting motions particularly along the lateral aspect of his left knee. No effusion. No numbness or tingling. No back pain or thigh discomfort. No obvious effusion. No locking or catching  HPI  Review of Systems    Constitutional: Negative for fatigue.  HENT: Positive for hearing loss.   Respiratory: Negative for apnea, chest tightness and shortness of breath.   Cardiovascular: Negative for chest pain, palpitations and leg swelling.  Gastrointestinal: Negative for blood in stool, constipation and diarrhea.  Genitourinary: Negative for difficulty urinating.  Musculoskeletal: Positive for back pain. Negative for arthralgias, joint swelling, myalgias, neck pain and neck stiffness.  Neurological: Negative for weakness, numbness and headaches.  Hematological: Does not bruise/bleed easily.  Psychiatric/Behavioral: Negative for sleep disturbance. The patient is not nervous/anxious.      Objective: Vital Signs: BP 106/66   Pulse (!) 14   Resp 14   Ht 5' 8.5" (1.74 m)   Wt 175 lb (79.4 kg)   BMI 26.22 kg/m   Physical Exam  Ortho Examleft knee without effusion. Mild posterior lateral joint pain. No masses. Skin intact. No pain along the iliotibial band. No instability. Noead. Neurovascular examtact. No medial joint pain. Minimal patellar crepitation. Full extension and flexion  Specialty Comments:  No specialty comments available.  Imaging: Xr Knee 3 View Left  Result Date: 06/08/2017 Films of the left knee were negative for fracture. Minimal degenerative change at pattellofemoral joint. Joint space maintained. No ectopic calcification.    PMFS History: Patient Active Problem List   Diagnosis Date Noted  . PAF (paroxysmal atrial fibrillation) ([place]) 11/07/2014  Past Medical History:  Diagnosis Date  . A-fib ([place])   . ADHD (attention deficit hyperactivity disorder)   . Cancer [facility])    prostate dr. Chriss Czar davis  . Chronic kidney disease    borderline stage III  . Fissure, anal   . Giardia    working in [place]  . Hyperlipidemia   . Infectious mononucleosis   . Low back pain   . Presbycusis of left ear   . Spinal stenosis of lumbar region   . Ulcerative colitis ([place])     Family  History  Problem Relation Age of Onset  . Stroke Mother 3  . Heart attack Father     Past Surgical History:  Procedure Laterality Date  . ANAL FISSURE REPAIR    . APPENDECTOMY    . PROSTATE SURGERY    . ROTATOR CUFF REPAIR Left   . VARICOCELE EXCISION Right    Social History   Occupational History  . Not on file.   Social History Main Topics  . Smoking status: Never Smoker  . Smokeless tobacco: Never Used  . Alcohol use 0.0 oz/week     Comment: 1-2 glasses of red wine  . Drug use: No  . Sexual activity: Not on file

## 2017-06-09 DIAGNOSIS — Z1211 Encounter for screening for malignant neoplasm of colon: Secondary | ICD-10-CM | POA: Diagnosis not present

## 2017-07-28 DIAGNOSIS — M48061 Spinal stenosis, lumbar region without neurogenic claudication: Secondary | ICD-10-CM | POA: Diagnosis not present

## 2017-10-01 ENCOUNTER — Other Ambulatory Visit: Payer: Self-pay | Admitting: Gastroenterology

## 2017-10-01 DIAGNOSIS — K589 Irritable bowel syndrome without diarrhea: Secondary | ICD-10-CM | POA: Diagnosis not present

## 2017-10-01 DIAGNOSIS — Z1389 Encounter for screening for other disorder: Secondary | ICD-10-CM | POA: Diagnosis not present

## 2017-10-01 DIAGNOSIS — Z8546 Personal history of malignant neoplasm of prostate: Secondary | ICD-10-CM | POA: Diagnosis not present

## 2017-10-01 DIAGNOSIS — R1011 Right upper quadrant pain: Secondary | ICD-10-CM | POA: Diagnosis not present

## 2017-10-01 DIAGNOSIS — K5190 Ulcerative colitis, unspecified, without complications: Secondary | ICD-10-CM | POA: Diagnosis not present

## 2017-10-01 DIAGNOSIS — I480 Paroxysmal atrial fibrillation: Secondary | ICD-10-CM | POA: Diagnosis not present

## 2017-10-07 ENCOUNTER — Ambulatory Visit
Admission: RE | Admit: 2017-10-07 | Discharge: 2017-10-07 | Disposition: A | Discharge status: Home / Self Care | Payer: Medicare Other | Source: Ambulatory Visit | Attending: Gastroenterology | Admitting: Gastroenterology

## 2017-10-07 DIAGNOSIS — R1011 Right upper quadrant pain: Secondary | ICD-10-CM | POA: Diagnosis not present

## 2017-10-16 ENCOUNTER — Ambulatory Visit: Payer: Medicare Other | Admitting: Cardiovascular Disease

## 2017-10-27 DIAGNOSIS — H10413 Chronic giant papillary conjunctivitis, bilateral: Secondary | ICD-10-CM | POA: Diagnosis not present

## 2017-10-27 DIAGNOSIS — H2513 Age-related nuclear cataract, bilateral: Secondary | ICD-10-CM | POA: Diagnosis not present

## 2017-10-29 DIAGNOSIS — L821 Other seborrheic keratosis: Secondary | ICD-10-CM | POA: Diagnosis not present

## 2017-10-29 DIAGNOSIS — D225 Melanocytic nevi of trunk: Secondary | ICD-10-CM | POA: Diagnosis not present

## 2017-10-29 DIAGNOSIS — D1801 Hemangioma of skin and subcutaneous tissue: Secondary | ICD-10-CM | POA: Diagnosis not present

## 2017-12-22 DIAGNOSIS — M5416 Radiculopathy, lumbar region: Secondary | ICD-10-CM | POA: Diagnosis not present

## 2017-12-22 DIAGNOSIS — M545 Low back pain: Secondary | ICD-10-CM | POA: Diagnosis not present

## 2017-12-25 ENCOUNTER — Encounter: Payer: Self-pay | Admitting: Cardiovascular Disease

## 2018-01-06 ENCOUNTER — Encounter: Payer: Self-pay | Admitting: Cardiovascular Disease

## 2018-01-06 ENCOUNTER — Ambulatory Visit (INDEPENDENT_AMBULATORY_CARE_PROVIDER_SITE_OTHER): Payer: Medicare Other | Admitting: Cardiovascular Disease

## 2018-01-06 VITALS — BP 120/78 | HR 59 | Ht 69.0 in | Wt 157.0 lb

## 2018-01-06 DIAGNOSIS — I480 Paroxysmal atrial fibrillation: Secondary | ICD-10-CM

## 2018-01-06 NOTE — Patient Instructions (Signed)

## 2018-01-06 NOTE — Progress Notes (Signed)
Cardiology Office Note   Date:  01/06/2018   ID:  Daniel Payne  Daniel Payne, DOB September 15, 1945, MRN [id number]  PCP:  Daniel Manes, MD  Cardiologist:   Daniel Moores, MD   Chief Complaint  Patient presents with  . Atrial Fibrillation  . Hyperlipidemia   1. Problem List:  1. Paroxysmal Atrial fib 2. Hyperlipidemia 3. Prostate surgery-status post robotic prostatectomy 4. Ulcerative colitis    Daniel Payne  Daniel Payne is a 72 y.o. 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  Has not fully controlled the bleeding. Best controlled by steroids.    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 He was cycling and went into rapid Afib with rate of 170.  He converted back to NSR in 3-4 hours. Had another episode of Afib while working in his garden.  Went to Daniel.  Carlyle Payne office . Afib was documeneted.  Was started on Dilt 30 QID.  Converted to NSR the next day He stopped the Dilt a week or so later.  Has developed episodes of sinus tachycardia recently.  Also associated with some chest tightness.  Does not last long.   This am he had very slight chest tightness while he was walking.  Lasted 5  Minutes.  No diaphoresis, no radiation.  Drinks wine with dinner.  02/08/2015:  Skin is doing well. He has paroxysmal atrial fibrillation. He has not wanted to start anticoagulation yet. He's had some problems with colitis. He had a stress Myoview study as part of his workup for some vague chest tightness. Myoview revealed no evidence of ischemia. He had normal left ventricle systolic function with an ejection fraction of 75%. Has been started on Humira for Ulcerative colits . Has been cycling quite a bit.  No issues.   No recurrent episodes of atrial fib .  He thinks it may have been related to the high dose prednisone   Sept. 7, 2016:  Daniel Payne is doing well. He stopped  his Eliquis  Doing well.  Cycling regularly .  November 14, 2015:   Doing well.  Still cycling .    Just got back from [facility] for snow skiing .  Daniel Payne, Daniel Payne, )  Very active .  His UC is well controlled  November 26, 2016  Daniel Payne is doing well Spent the past 8 days skiing our in [place] 20-25 days a year now  Does well even at high altitude. Also hiked last year in [place] without any problems   Jan 06, 2018 :  Daniel Payne is seen today for follow-up of his atrial fibrillation and hyperlipidemia.  He has maintained normal sinus rhythm. Very active.  Skis quite a bit .  Walks 2 -3 miles with the dogs 4 days . Rides 15-20 miles on his bike once a week . No palpitations  Doing well on the Dilt Daniel 120 mg a day   Past Medical History:  Diagnosis Date  . A-fib (Daniel Payne)   . ADHD (attention deficit hyperactivity disorder)   . Cancer [facility])    prostate Daniel. Chriss Czar Payne  . Chronic kidney disease    borderline stage III  . Fissure, anal   . Giardia    working in [place]  . Hyperlipidemia   . Infectious mononucleosis   . Low back pain   . Presbycusis of left ear   .  Spinal stenosis of lumbar region   . Ulcerative colitis [facility])     Past Surgical History:  Procedure Laterality Date  . ANAL FISSURE REPAIR    . APPENDECTOMY    . PROSTATE SURGERY    . ROTATOR CUFF REPAIR Left   . VARICOCELE EXCISION Right      Current Outpatient Medications  Medication Sig Dispense Refill  . acetaminophen (TYLENOL) 500 MG tablet Take 500 mg by mouth every 6 (six) hours as needed.    Marland Kitchen aspirin 81 MG tablet Take 81 mg by mouth daily.    Marland Kitchen atorvastatin (LIPITOR) 10 MG tablet Take 10 mg by mouth daily.  11  . diltiazem (DILT-XR) 120 MG 24 hr capsule Take 1 capsule (120 mg total) by mouth daily. 90 capsule 3  . diphenhydrAMINE (BENADRYL) 25 MG tablet Take 25 mg by mouth every 6 (six) hours as needed.    Marland Kitchen HUMIRA PEN 40 MG/0.8ML PNKT Inject as directed. EVERY 2 WEEKS  11  . Melatonin 5 MG TABS Take  1 tablet by mouth at bedtime.     No current facility-administered medications for this visit.     Allergies:   Concerta [methylphenidate]; Hydrocortisone; and Lialda [mesalamine]    Social History:  The patient  reports that he has never smoked. He has never used smokeless tobacco. He reports that he drinks alcohol. He reports that he does not use drugs.   Family History:  The patient's family history includes Heart attack in his father; Stroke (age of onset: 14) in his mother.    ROS:  Noted in current history, otherwise review of systems is negative.  Physical Exam: Blood pressure 120/78, pulse (!) 59, height 5' 9"  (1.753 m), weight 157 lb (71.2 kg), SpO2 96 %.  GEN:  Well nourished, well developed in no acute distress HEENT: Normal NECK: No JVD; No carotid bruits LYMPHATICS: No lymphadenopathy CARDIAC: RRR , no murmurs, rubs, gallops RESPIRATORY:  Clear to auscultation without rales, wheezing or rhonchi  ABDOMEN: Soft, non-tender, non-distended MUSCULOSKELETAL:  No edema; No deformity  SKIN: Warm and dry NEUROLOGIC:  Alert and oriented x 3   EKG: Jan 06, 2018: Sinus bradycardia 59 beats minute.  The EKG is otherwise normal.  Recent Labs: No results found for requested labs within last 8760 hours.    Lipid Panel No results found for: CHOL, TRIG, HDL, CHOLHDL, VLDL, LDLCALC, LDLDIRECT    Wt Readings from Last 3 Encounters:  01/06/18 157 lb (71.2 kg)  06/08/17 175 lb (79.4 kg)  11/26/16 163 lb 3.2 oz (74 kg)      Other studies Reviewed: Additional studies/ records that were reviewed today include: . Review of the above records demonstrates:    ASSESSMENT AND PLAN:  1.  Paroxysmal atrial fibrillation- can presents with episodes of paroxysmal atrial fibrillation.   His HADS2 VASC score is 1 ( age) .  He has not had any recurrent episodes of atrial for ablation.  He is tolerating the diltiazem long-acting 120 mg a day very well.  He is very active.  He is a  little hesitant to consider anticoagulation even in 2 years.  We will continue to have that discussion. His CHADS2VASC will be 2 in 2 years ( age) .      Current medicines are reviewed at length with the patient today.  The patient does not have concerns regarding medicines.  The following changes have been made:  no change  Disposition:   FU with me in 1  year    Signed, Daniel Moores, MD  01/06/2018 9:16 AM    [facility] [place], [place], [place]  [address] Phone: [phone]; Fax: [phone]

## 2018-01-12 DIAGNOSIS — M48061 Spinal stenosis, lumbar region without neurogenic claudication: Secondary | ICD-10-CM | POA: Diagnosis not present

## 2018-01-27 DIAGNOSIS — M48061 Spinal stenosis, lumbar region without neurogenic claudication: Secondary | ICD-10-CM | POA: Diagnosis not present

## 2018-02-12 ENCOUNTER — Ambulatory Visit (INDEPENDENT_AMBULATORY_CARE_PROVIDER_SITE_OTHER): Payer: Medicare Other | Admitting: *Deleted

## 2018-02-12 VITALS — BP 102/58 | HR 81 | Ht 69.0 in | Wt 157.0 lb

## 2018-02-12 DIAGNOSIS — I4891 Unspecified atrial fibrillation: Secondary | ICD-10-CM

## 2018-02-12 MED ORDER — DILTIAZEM HCL 30 MG PO TABS: 30.0000 mg | ORAL_TABLET | ORAL | 3 refills | Status: DC | PRN

## 2018-02-12 MED ORDER — APIXABAN 5 MG PO TABS: 5.0000 mg | ORAL_TABLET | Freq: Two times a day (BID) | ORAL | 3 refills | Status: DC

## 2018-02-12 NOTE — Progress Notes (Signed)
Pt came in today for  ekg and b/p check  due to episode of afib yesterday per Dr Acie Fredrickson .Dr Acie Fredrickson reviewed EKG and pt has converted .Pt to continue to monitor and will call back on Monday with update.

## 2018-04-20 DIAGNOSIS — Z125 Encounter for screening for malignant neoplasm of prostate: Secondary | ICD-10-CM | POA: Diagnosis not present

## 2018-04-20 DIAGNOSIS — I480 Paroxysmal atrial fibrillation: Secondary | ICD-10-CM | POA: Diagnosis not present

## 2018-04-20 DIAGNOSIS — E7800 Pure hypercholesterolemia, unspecified: Secondary | ICD-10-CM | POA: Diagnosis not present

## 2018-04-20 DIAGNOSIS — Z79899 Other long term (current) drug therapy: Secondary | ICD-10-CM | POA: Diagnosis not present

## 2018-04-20 DIAGNOSIS — Z1349 Encounter for screening for other developmental delays: Secondary | ICD-10-CM | POA: Diagnosis not present

## 2018-04-20 DIAGNOSIS — Z0000 Encounter for general adult medical examination without abnormal findings: Secondary | ICD-10-CM | POA: Diagnosis not present

## 2018-04-20 DIAGNOSIS — Z8546 Personal history of malignant neoplasm of prostate: Secondary | ICD-10-CM | POA: Diagnosis not present

## 2018-04-20 DIAGNOSIS — K5190 Ulcerative colitis, unspecified, without complications: Secondary | ICD-10-CM | POA: Diagnosis not present

## 2018-04-20 DIAGNOSIS — Z1389 Encounter for screening for other disorder: Secondary | ICD-10-CM | POA: Diagnosis not present

## 2018-04-22 ENCOUNTER — Encounter (INDEPENDENT_AMBULATORY_CARE_PROVIDER_SITE_OTHER): Payer: Self-pay | Admitting: Orthopaedic Surgery

## 2018-04-22 ENCOUNTER — Ambulatory Visit (INDEPENDENT_AMBULATORY_CARE_PROVIDER_SITE_OTHER): Payer: Medicare Other | Admitting: Orthopaedic Surgery

## 2018-04-22 VITALS — BP 133/82 | HR 66 | Ht 69.5 in | Wt 152.0 lb

## 2018-04-22 DIAGNOSIS — M79604 Pain in right leg: Secondary | ICD-10-CM

## 2018-04-22 NOTE — Progress Notes (Signed)
Office Visit Note   Patient: Daniel Payne           Date of Birth: 1946-09-03           MRN: [id number] Visit Date: 04/22/2018              Requested by: Daniel Manes, MD [address] [place], [place] [address] PCP: Daniel Manes, MD   Assessment & Plan: Visit Diagnoses:  1. Pain in right leg     Plan: Hamstring pull right thigh.  This should resolve on its own over the next 3 to 4 weeks.  Activity as tolerated.  If no improvement over the next 4 weeks I like him to return.  Follow-Up Instructions: Return if symptoms worsen or fail to improve.   Orders:  No orders of the defined types were placed in this encounter.  No orders of the defined types were placed in this encounter.     Procedures: No procedures performed   Clinical Data: No additional findings.   Subjective: Chief Complaint  Patient presents with  . Follow-up    04/12/18 HURT RIGHT LEG WHILE A GOLF PRACTICE HAS SOME LIMPING   Daniel Payne ranged acute onset of right posterior proximal thigh pain about 10 days ago while swinging a golf club he does have a chronic history of back pain with stenosis and thought that it may be related to his back.  He tried a short course of steroids without any relief.  He is feeling better with time but still having some discomfort with certain activities.  He has not noticed any skin changes or ecchymosis.  Pain is fairly well localized in the proximal posterior thigh.  Denies any groin pain.  Not experiencing any back pain.  No discomfort on the left side.  Numbness or tingling  HPI  Review of Systems  Constitutional: Negative for fatigue and fever.  HENT: Negative for ear pain.   Eyes: Negative for pain.  Respiratory: Negative for cough and shortness of breath.   Cardiovascular: Negative for leg swelling.  Gastrointestinal: Negative for constipation and diarrhea.  Genitourinary: Negative for difficulty urinating.  Musculoskeletal: Positive for back  pain. Negative for neck pain.  Skin: Negative for rash.  Allergic/Immunologic: Negative for food allergies.  Neurological: Positive for weakness. Negative for numbness.  Hematological: Does not bruise/bleed easily.  Psychiatric/Behavioral: Negative for sleep disturbance.     Objective: Vital Signs: BP 133/82 (BP Location: Left Arm, Patient Position: Sitting, Cuff Size: Normal)   Pulse 66   Ht 5' 9.5" (1.765 m)   Wt 152 lb (68.9 kg)   BMI 22.12 kg/m   Physical Exam  Constitutional: He is oriented to person, place, and time. He appears well-developed and well-nourished.  HENT:  Mouth/Throat: Oropharynx is clear and moist.  Eyes: Pupils are equal, round, and reactive to light. EOM are normal.  Pulmonary/Chest: Effort normal.  Neurological: He is alert and oriented to person, place, and time.  Skin: Skin is warm and dry.  Psychiatric: He has a normal mood and affect. His behavior is normal.    Ortho Exam awake alert and oriented x3.  Comfortable sitting.  Hamstrings are tight.  Some discomfort with knee extension and hip flexion along the proximal right thigh.  No masses.  No ecchymosis.  No knee pain.  No groin discomfort.  Straight leg raise negative.  No percussible tenderness of the lumbar spine.  Specialty Comments:  No specialty comments available.  Imaging:  No results found.   PMFS History: Patient Active Problem List   Diagnosis Date Noted  . PAF (paroxysmal atrial fibrillation) ([place]) 11/07/2014   Past Medical History:  Diagnosis Date  . A-fib ([place])   . ADHD (attention deficit hyperactivity disorder)   . Cancer [facility])    prostate Daniel. Chriss Czar Payne  . Chronic kidney disease    borderline stage III  . Fissure, anal   . Giardia    working in [place]  . Hyperlipidemia   . Infectious mononucleosis   . Low back pain   . Presbycusis of left ear   . Spinal stenosis of lumbar region   . Ulcerative colitis ([place])     Family History  Problem Relation Age of Onset  .  Stroke Mother 83  . Heart attack Father     Past Surgical History:  Procedure Laterality Date  . ANAL FISSURE REPAIR    . APPENDECTOMY    . PROSTATE SURGERY    . ROTATOR CUFF REPAIR Left   . VARICOCELE EXCISION Right    Social History   Occupational History  . Not on file  Tobacco Use  . Smoking status: Never Smoker  . Smokeless tobacco: Never Used  Substance and Sexual Activity  . Alcohol use: Yes    Alcohol/week: 0.0 standard drinks    Comment: 1-2 glasses of red wine  . Drug use: No  . Sexual activity: Not on file

## 2018-05-05 ENCOUNTER — Other Ambulatory Visit: Payer: Self-pay | Admitting: Cardiovascular Disease

## 2018-05-05 DIAGNOSIS — D1801 Hemangioma of skin and subcutaneous tissue: Secondary | ICD-10-CM | POA: Diagnosis not present

## 2018-05-05 DIAGNOSIS — L821 Other seborrheic keratosis: Secondary | ICD-10-CM | POA: Diagnosis not present

## 2018-05-29 DIAGNOSIS — Z23 Encounter for immunization: Secondary | ICD-10-CM | POA: Diagnosis not present

## 2018-06-30 ENCOUNTER — Ambulatory Visit (INDEPENDENT_AMBULATORY_CARE_PROVIDER_SITE_OTHER): Payer: Medicare Other | Admitting: Cardiovascular Disease

## 2018-06-30 ENCOUNTER — Encounter: Payer: Self-pay | Admitting: Cardiovascular Disease

## 2018-06-30 ENCOUNTER — Telehealth: Payer: Self-pay | Admitting: Cardiovascular Disease

## 2018-06-30 VITALS — BP 106/72 | HR 85 | Ht 65.9 in | Wt 154.0 lb

## 2018-06-30 DIAGNOSIS — Z7901 Long term (current) use of anticoagulants: Secondary | ICD-10-CM

## 2018-06-30 DIAGNOSIS — I480 Paroxysmal atrial fibrillation: Secondary | ICD-10-CM

## 2018-06-30 LAB — BASIC METABOLIC PANEL
BUN/Creatinine Ratio: 17 (ref 10–24)
BUN: 20 mg/dL (ref 8–27)
CO2: 24 mmol/L (ref 20–29)
Calcium: 10.1 mg/dL (ref 8.6–10.2)
Chloride: 98 mmol/L (ref 96–106)
Creatinine, Ser: 1.21 mg/dL (ref 0.76–1.27)
GFR calc Af Amer: 68 mL/min/{1.73_m2} (ref >59)
GFR calc non Af Amer: 59 mL/min/{1.73_m2} — ABNORMAL LOW (ref >59)
Glucose: 87 mg/dL (ref 65–99)
Potassium: 4.7 mmol/L (ref 3.5–5.2)
Sodium: 138 mmol/L (ref 134–144)

## 2018-06-30 LAB — CBC
Hematocrit: 47.9 % (ref 37.5–51.0)
Hemoglobin: 16.8 g/dL (ref 13.0–17.7)
MCH: 31.8 pg (ref 26.6–33.0)
MCHC: 35.1 g/dL (ref 31.5–35.7)
MCV: 91 fL (ref 79–97)
Platelets: 235 10*3/uL (ref 150–450)
RBC: 5.29 x10E6/uL (ref 4.14–5.80)
RDW: 13.6 % (ref 12.3–15.4)
WBC: 10.5 10*3/uL (ref 3.4–10.8)

## 2018-06-30 MED ORDER — PROPRANOLOL HCL 10 MG PO TABS: 10.0000 mg | ORAL_TABLET | Freq: Four times a day (QID) | ORAL | 6 refills | Status: DC

## 2018-06-30 MED ORDER — APIXABAN 5 MG PO TABS: 5.0000 mg | ORAL_TABLET | Freq: Two times a day (BID) | ORAL | 11 refills | Status: DC

## 2018-06-30 NOTE — Telephone Encounter (Signed)
New Message   Pt is calling, states he wants to get an EKG, says he wants it now to see if he is in afib.  Please call

## 2018-06-30 NOTE — Telephone Encounter (Signed)
Patient contacted Dr. Acie Fredrickson directly and is coming in for an office visit today at [place].

## 2018-06-30 NOTE — Progress Notes (Signed)
Cardiology Office Note   Date:  06/30/2018   ID:  [name]  DR ARTAVIS COWIE, DOB 1946/02/14, MRN [id number]  PCP:  Lajean Manes, MD  Cardiologist:   Mertie Moores, MD   Chief Complaint  Patient presents with  . Atrial Fibrillation   1. Problem List:  1. Paroxysmal Atrial fib 2. Hyperlipidemia 3. Prostate surgery-status post robotic prostatectomy 4. Ulcerative colitis    [place]  DR WELLES WALTHALL is a 72 y.o. 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  Has not fully controlled the bleeding. Best controlled by steroids.    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 He was cycling and went into rapid Afib with rate of 170.  He converted back to NSR in 3-4 hours. Had another episode of Afib while working in his garden.  Went to Dr.  Carlyle Lipa office . Afib was documeneted.  Was started on Dilt 30 QID.  Converted to NSR the next day He stopped the Dilt a week or so later.  Has developed episodes of sinus tachycardia recently.  Also associated with some chest tightness.  Does not last long.   This am he had very slight chest tightness while he was walking.  Lasted 5  Minutes.  No diaphoresis, no radiation.  Drinks wine with dinner.  02/08/2015:  Skin is doing well. He has paroxysmal atrial fibrillation. He has not wanted to start anticoagulation yet. He's had some problems with colitis. He had a stress Myoview study as part of his workup for some vague chest tightness. Myoview revealed no evidence of ischemia. He had normal left ventricle systolic function with an ejection fraction of 75%. Has been started on Humira for Ulcerative colits . Has been cycling quite a bit.  No issues.   No recurrent episodes of atrial fib .  He thinks it may have been related to the high dose prednisone   Sept. 7, 2016:  [name] is doing well. He stopped his Eliquis    Doing well.  Cycling regularly .  November 14, 2015:   Doing well.  Still cycling .    Just got back from [facility] for snow skiing .  [name], [place], )  Very active .  His UC is well controlled  November 26, 2016  [name] is doing well Spent the past 8 days skiing our in [place] 20-25 days a year now  Does well even at high altitude. Also hiked last year in [place] without any problems   Jan 06, 2018 :  [name] is seen today for follow-up of his atrial fibrillation and hyperlipidemia.  He has maintained normal sinus rhythm. Very active.  Skis quite a bit .  Walks 2 -3 miles with the dogs 4 days . Rides 15-20 miles on his bike once a week . No palpitations  Doing well on the Dilt LA 120 mg a day   June 30, 2018:  [name] is seen as a work in visit today.  He was recently on a trans -atlantic  flight and was awake for 26 hours.  He went into atrial for ablation.  He tried an extra half a diltiazem tablet but he remains in atrial fib .   Slept well but has not converted to NSR   He is planning on going up to his [place] for the weekend.   Past Medical History:  Diagnosis Date  . A-fib ([place])   . ADHD (attention deficit hyperactivity disorder)   . Cancer [facility])    prostate dr. Chriss Czar davis  . Chronic kidney disease    borderline stage III  . Fissure, anal   . Giardia    working in [place]  . Hyperlipidemia   . Infectious mononucleosis   . Low back pain   . Presbycusis of left ear   . Spinal stenosis of lumbar region   . Ulcerative colitis [facility])     Past Surgical History:  Procedure Laterality Date  . ANAL FISSURE REPAIR    . APPENDECTOMY    . PROSTATE SURGERY    . ROTATOR CUFF REPAIR Left   . VARICOCELE EXCISION Right      Current Outpatient Medications  Medication Sig Dispense Refill  . acetaminophen (TYLENOL) 500 MG tablet Take 500 mg by mouth every 6 (six) hours as needed.    Marland Kitchen atorvastatin (LIPITOR) 10 MG tablet Take 10 mg  by mouth daily.  11  . DILT-XR 120 MG 24 hr capsule TAKE 1 CAPSULE(120 MG) BY MOUTH DAILY 90 capsule 2  . diltiazem (CARDIZEM) 30 MG tablet Take 1 tablet (30 mg total) by mouth as needed. May take in addition if has afib episode 30 tablet 3  . diphenhydrAMINE (BENADRYL) 25 MG tablet Take 25 mg by mouth every 6 (six) hours as needed.    Marland Kitchen HUMIRA PEN 40 MG/0.8ML PNKT Inject as directed. EVERY 2 WEEKS  11  . Melatonin 5 MG TABS Take 1 tablet by mouth at bedtime.    Marland Kitchen apixaban (ELIQUIS) 5 MG TABS tablet Take 1 tablet (5 mg total) by mouth 2 (two) times daily. (Patient not taking: Reported on 06/30/2018) 60 tablet 3   No current facility-administered medications for this visit.     Allergies:   Concerta [methylphenidate]; Hydrocortisone; and Lialda [mesalamine]    Social History:  The patient  reports that he has never smoked. He has never used smokeless tobacco. He reports that he drinks alcohol. He reports that he does not use drugs.   Family History:  The patient's family history includes Heart attack in his father; Stroke (age of onset: 67) in his mother.    ROS:  Noted in current history, otherwise review of systems is negative.  Physical Exam: Blood pressure 106/72, pulse 85, height 5' 5.9" (1.674 m), weight 154 lb (69.9 kg), SpO2 96 %.  GEN: Elderly gentleman, no acute distress. HEENT: Normal NECK: No JVD; No carotid bruits LYMPHATICS: No lymphadenopathy CARDIAC: irreg. Irreg.  RESPIRATORY:  Clear to auscultation without rales, wheezing or rhonchi  ABDOMEN: Soft, non-tender, non-distended MUSCULOSKELETAL:  No edema; No deformity  SKIN: Warm and dry NEUROLOGIC:  Alert and oriented x 3   EKG:    June 30, 2018: Atrial fibrillation with a heart rate of 85.  Recent Labs: No results found for requested labs within last 8760 hours.    Lipid Panel No results found for: CHOL, TRIG, HDL, CHOLHDL, VLDL, LDLCALC, LDLDIRECT    Wt Readings from Last 3 Encounters:  06/30/18 154  lb (69.9 kg)  04/22/18 152 lb (68.9 kg)  02/12/18 157 lb (71.2 kg)      Other studies Reviewed: Additional studies/ records that were reviewed today include: . Review of the above records demonstrates:    ASSESSMENT AND PLAN:  1.  Paroxysmal atrial fibrillation-   [name] has been in atrial fibrillation since  yesterday.  He was on a long transatlantic flight and was awake for approximately 26 hours.  He was very fatigued.  He tried taking an extra diltiazem tablet but remains in atrial fibrillation.  We will have him start propranolol 10 mg 4 times daily for the next several days.  If he remains in atrial ablation for the next 4 to 5 days we will change him to a longer acting beta-blocker.  He will continue the diltiazem slow release 120 mg a day.  Call was on Monday to let us know how he is doing.  If he converts back to normal rhythm in the next several days I have recommended that he continue Eliquis for at least 3 weeks.  I will see him again in 3 to 4 weeks for follow-up visit.  We will anticipate doing a cardioversion if he is not already cardioverted.  We will check a CBC and a basic metabolic profile today. His CHADS2VASC will be 2 in 2 years ( age) .    Current medicines are reviewed at length with the patient today.  The patient does not have concerns regarding medicines.  The following changes have been made:   Add Eliquis 5 mg BID  Propranolol 10 QID    Disposition:   FU with me in  3-4 weeks .    Signed, Mertie Moores, MD  06/30/2018 10:19 AM    [facility] [place], [place], [place]  [address] Phone: [phone]; Fax: [phone]

## 2018-06-30 NOTE — Patient Instructions (Addendum)
Medication Instructions:  Your physician has recommended you make the following change in your medication:   START Eliquis 5 mg twice daily START Propranolol 10 mg 4 times per day  If you need a refill on your cardiac medications before your next appointment, please call your pharmacy.   Lab work: TODAY - Medical laboratory scientific officer, CBC  If you have labs (blood work) drawn today and your tests are completely normal, you will receive your results only by: Marland Kitchen MyChart Message (if you have MyChart) OR . A paper copy in the mail If you have any lab test that is abnormal or we need to change your treatment, we will call you to review the results.  Testing/Procedures: None Ordered   Follow-Up: Call Monday to report how you are feeling  Your physician recommends that you return for a follow-up appointment on Thursday Nov. 21 at 8:20 am

## 2018-07-02 ENCOUNTER — Telehealth: Payer: Self-pay | Admitting: Cardiovascular Disease

## 2018-07-02 MED ORDER — DILTIAZEM HCL ER 240 MG PO CP24: 240.0000 mg | ORAL_CAPSULE | Freq: Every day | ORAL | Status: DC

## 2018-07-02 NOTE — Telephone Encounter (Signed)
Spoke with Dr Acie Fredrickson who has spoken with the pt.  Pt is to d/c propranolol and increase Diltiazem to 240 mg daily.  He is to be scheduled for out TEE guided cardioversion next week for At Fib.  Pt is taking Eliquis (recent start) and has had recent blood work (10/23).  Was able to schedule procedure for Tuesday July 06, 2018 at 10 am with Dr Candee Furbish, case # [id number].  Pt to arrive NPO except medications with sips of water, at the [facility] at 8:30 am.  Left message on cell number for patient to call back to review instructions, date and time.

## 2018-07-02 NOTE — Telephone Encounter (Signed)
Called patient back. Patient complaining of feeling tired and wondering if he should keep taking his propranolol. Patient stated his HR is 85, but does not know his BP. Patient was wanting to know if he might can take an extra diltiazem. Patient stated he could just push through the weekend taking the propranolol. Informed patient that he has diltiazem 30 mg to take as needed for afib episodes. Informed patient it would help to know what his BP is, and if it's too low, he should stop the propranolol and just try the prn diltiazem. Patient really wanted Dr. Elmarie Shiley recommendation. Informed patient that Dr. Acie Fredrickson was out, but a message would be sent to him. Patient stated he has Dr. Elmarie Shiley cell number, and he would give him a call. Patent stated he was going to see what his BP is first, and would have to go to a drug [place] Will forward to Dr. Acie Fredrickson for advisement.

## 2018-07-02 NOTE — Telephone Encounter (Signed)
(  SmartPhrase not working)  Patients state since he started propranolol (INDERAL) 10 MG tablet on Wednesday, and since then he has felt exteremly tired.

## 2018-07-02 NOTE — Telephone Encounter (Signed)
Spoke with patient and reviewed instructions.  All questions answered. He is aware of where to report,date,time, NPO, bring med list, insurance cards and someone to drive him home.

## 2018-07-04 ENCOUNTER — Other Ambulatory Visit: Payer: Self-pay | Admitting: Cardiovascular Disease

## 2018-07-04 NOTE — Progress Notes (Unsigned)
Dr. Sonny Dandy has converted to NSR Continue Diltiazem 240 CD a day  He has stopped the propranolol  Will cancel the scheduled TEE / cardioversion for Tuesday   Continue Eliquis for 3 weeks   Will see him in the office in several months     Mertie Moores, MD  07/04/2018 5:01 PM    [facility] [address],  [place], [place]  [address] Pager [phone] Phone: [phone]; Fax: [phone]

## 2018-07-06 ENCOUNTER — Encounter (HOSPITAL_COMMUNITY): Payer: Self-pay

## 2018-07-06 ENCOUNTER — Ambulatory Visit (HOSPITAL_COMMUNITY): Admit: 2018-07-06 | Payer: Medicare Other | Admitting: Cardiology

## 2018-07-06 SURGERY — CARDIOVERSION
Anesthesia: General

## 2018-07-29 ENCOUNTER — Encounter: Payer: Self-pay | Admitting: Cardiovascular Disease

## 2018-07-29 ENCOUNTER — Ambulatory Visit (INDEPENDENT_AMBULATORY_CARE_PROVIDER_SITE_OTHER): Payer: Medicare Other | Admitting: Cardiovascular Disease

## 2018-07-29 VITALS — BP 118/62 | HR 68 | Ht 65.9 in | Wt 154.0 lb

## 2018-07-29 DIAGNOSIS — I480 Paroxysmal atrial fibrillation: Secondary | ICD-10-CM

## 2018-07-29 DIAGNOSIS — E782 Mixed hyperlipidemia: Secondary | ICD-10-CM

## 2018-07-29 MED ORDER — DILTIAZEM HCL ER 120 MG PO CP24: 120.0000 mg | ORAL_CAPSULE | Freq: Two times a day (BID) | ORAL | 3 refills | Status: DC

## 2018-07-29 MED ORDER — DILTIAZEM HCL ER COATED BEADS 120 MG PO CP24: 120.0000 mg | ORAL_CAPSULE | Freq: Two times a day (BID) | ORAL | 3 refills | Status: DC

## 2018-07-29 MED ORDER — DILTIAZEM HCL 30 MG PO TABS: 30.0000 mg | ORAL_TABLET | ORAL | 3 refills | Status: DC | PRN

## 2018-07-29 NOTE — Progress Notes (Signed)
Cardiology Office Note   Date:  07/29/2018   ID:  Daniel Payne  DR Daniel Payne, DOB [birth year], MRN [id number]  PCP:  Daniel Manes, MD  Cardiologist:   Daniel Moores, MD   Chief Complaint  Patient presents with  . Atrial Fibrillation   1. Problem List:  1. Paroxysmal Atrial fib 2. Hyperlipidemia 3. Prostate surgery-status post robotic prostatectomy 4. Ulcerative colitis    [place]  DR Daniel Payne is a 72 y.o. 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  Has not fully controlled the bleeding. Best controlled by steroids.    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 He was cycling and went into rapid Afib with rate of 170.  He converted back to NSR in 3-4 hours. Had another episode of Afib while working in his garden.  Went to Dr.  Carlyle Payne office . Afib was documeneted.  Was started on Dilt 30 QID.  Converted to NSR the next day He stopped the Dilt a week or so later.  Has developed episodes of sinus tachycardia recently.  Also associated with some chest tightness.  Does not last long.   This am he had very slight chest tightness while he was walking.  Lasted 5  Minutes.  No diaphoresis, no radiation.  Drinks wine with dinner.  02/08/2015:  Skin is doing well. He has paroxysmal atrial fibrillation. He has not wanted to start anticoagulation yet. He's had some problems with colitis. He had a stress Myoview study as part of his workup for some vague chest tightness. Myoview revealed no evidence of ischemia. He had normal left ventricle systolic function with an ejection fraction of 75%. Has been started on Humira for Ulcerative colits . Has been cycling quite a bit.  No issues.   No recurrent episodes of atrial fib .  He thinks it may have been related to the high dose prednisone   Sept. 7, 2016:  Daniel Payne is doing well. He stopped his Eliquis    Doing well.  Cycling regularly .  November 14, 2015:   Doing well.  Still cycling .    Just got back from [facility] for snow skiing .  Daniel Payne, [place], )  Very active .  His UC is well controlled  November 26, 2016  Daniel Payne is doing well Spent the past 8 days skiing our in [place] 20-25 days a year now  Does well even at high altitude. Also hiked last year in [place] without any problems   Jan 06, 2018 :  Daniel Payne is seen today for follow-up of his atrial fibrillation and hyperlipidemia.  He has maintained normal sinus rhythm. Very active.  Skis quite a bit .  Walks 2 -3 miles with the dogs 4 days . Rides 15-20 miles on his bike once a week . No palpitations  Doing well on the Dilt LA 120 mg a day   June 30, 2018:  Daniel Payne is seen as a work in visit today.  He was recently on a trans -atlantic  flight and was awake for 26 hours.  He went into atrial for ablation.  He tried an extra half a diltiazem tablet but he remains in atrial fib .   Slept well but has not converted to NSR   He is planning on going up to his house in [place], [place]  for the weekend.  Nov. 21, 2019  Daniel Payne is doing well,   He was seen for persistent AF on Oct. 23 . Started Eliquis He increased his Dilt to 120 BID, took an extra 30 mg of immediate release Dilt Converted to [name] a cold , went back into AF. Increased his Dilt again ( 120 BID + Dilt 30 ) converted back into NSR Continued Elquis for 1 week after wards  Feels great.   Is planning on going to [place] on Dec. 7 to ski.       Past Medical History:  Diagnosis Date  . A-fib (Daniel Payne)   . ADHD (attention deficit hyperactivity disorder)   . Cancer [facility])    prostate dr. Chriss Czar davis  . Chronic kidney disease    borderline stage III  . Fissure, anal   . Giardia    working in [place]  . Hyperlipidemia   . Infectious mononucleosis   . Low back pain   . Presbycusis of left ear   . Spinal stenosis of lumbar region   .  Ulcerative colitis [facility])     Past Surgical History:  Procedure Laterality Date  . ANAL FISSURE REPAIR    . APPENDECTOMY    . PROSTATE SURGERY    . ROTATOR CUFF REPAIR Left   . VARICOCELE EXCISION Right      Current Outpatient Medications  Medication Sig Dispense Refill  . acetaminophen (TYLENOL) 500 MG tablet Take 500 mg by mouth every 6 (six) hours as needed.    Marland Kitchen atorvastatin (LIPITOR) 10 MG tablet Take 10 mg by mouth daily.  11  . diltiazem (CARDIZEM) 30 MG tablet Take 1 tablet (30 mg total) by mouth as needed. May take in addition if has afib episode 30 tablet 3  . diltiazem (DILACOR XR) 120 MG 24 hr capsule Take 120 mg by mouth daily.    . diphenhydrAMINE (BENADRYL) 25 MG tablet Take 25 mg by mouth every 6 (six) hours as needed.    Marland Kitchen HUMIRA PEN 40 MG/0.8ML PNKT Inject as directed. EVERY 2 WEEKS  11  . Melatonin 5 MG TABS Take 1 tablet by mouth at bedtime.     No current facility-administered medications for this visit.     Allergies:   Concerta [methylphenidate]; Hydrocortisone; and Lialda [mesalamine]    Social History:  The patient  reports that he has never smoked. He has never used smokeless tobacco. He reports that he drinks alcohol. He reports that he does not use drugs.   Family History:  The patient's family history includes Heart attack in his father; Stroke (age of onset: 86) in his mother.    ROS:  Noted in current history, otherwise review of systems is negative.  Physical Exam: Blood pressure 118/62, pulse 68, height 5' 5.9" (1.674 m), weight 154 lb (69.9 kg), SpO2 93 %.  GEN:  Well nourished, well developed in no acute distress HEENT: Normal NECK: No JVD; No carotid bruits LYMPHATICS: No lymphadenopathy CARDIAC: RRR   RESPIRATORY:  Clear to auscultation without rales, wheezing or rhonchi  ABDOMEN: Soft, non-tender, non-distended MUSCULOSKELETAL:  No edema; No deformity  SKIN: Warm and dry NEUROLOGIC:  Alert and oriented x 3   EKG:    Nov. 21,  2019:   NSR at 89.    Normal ECG   Recent Labs: 06/30/2018: BUN 20; Creatinine, Ser 1.21; Hemoglobin 16.8; Platelets 235; Potassium 4.7; Sodium 138    Lipid Panel No results found for: CHOL, TRIG, HDL,  CHOLHDL, VLDL, LDLCALC, LDLDIRECT    Wt Readings from Last 3 Encounters:  07/29/18 154 lb (69.9 kg)  06/30/18 154 lb (69.9 kg)  04/22/18 152 lb (68.9 kg)      Other studies Reviewed: Additional studies/ records that were reviewed today include: . Review of the above records demonstrates:    ASSESSMENT AND PLAN:  1.  Paroxysmal atrial fibrillation-    he has converted to NSR  He continued Eliquis for a week or so after converting  He has done well  Will increase dilt to 120 mg PO BID to help prevent PAF - especially while he is up skiing this Dec.  in  [place]  Will see him in 6 months     Current medicines are reviewed at length with the patient today.  The patient does not have concerns regarding medicines.  The following changes have been made:   Add Eliquis 5 mg BID  Propranolol 10 QID    Disposition:   FU with me in  3-4 weeks .    Signed, Daniel Moores, MD  07/29/2018 8:36 AM    [facility] [place], [place], [place]  [address] Phone: [phone]; Fax: [phone]

## 2018-07-29 NOTE — Patient Instructions (Addendum)
Medication Instructions:  Your physician has recommended you make the following change in your medication:   INCREASE Diltiazem to 120 mg twice daily  If you need a refill on your cardiac medications before your next appointment, please call your pharmacy.   Lab work: None Ordered   Testing/Procedures: None Ordered   Follow-Up: At [facility], you and your health needs are our priority.  As part of our continuing mission to provide you with exceptional heart care, we have created designated Provider Care Teams.  These Care Teams include your primary Cardiologist (physician) and Advanced Practice Providers (APPs -  Physician Assistants and Nurse Practitioners) who all work together to provide you with the care you need, when you need it. You will need a follow up appointment in:  6 months.  Please call our office 2 months in advance to schedule this appointment.  You may see Mertie Moores, MD or one of the following Advanced Practice Providers on your designated Care Team: Richardson Dopp, PA-C [place], [place] . Daune Perch, NP

## 2018-10-12 ENCOUNTER — Other Ambulatory Visit: Payer: Self-pay | Admitting: Gastroenterology

## 2018-10-12 DIAGNOSIS — R053 Chronic cough: Secondary | ICD-10-CM

## 2018-10-12 DIAGNOSIS — Z8546 Personal history of malignant neoplasm of prostate: Secondary | ICD-10-CM | POA: Diagnosis not present

## 2018-10-12 DIAGNOSIS — K589 Irritable bowel syndrome without diarrhea: Secondary | ICD-10-CM | POA: Diagnosis not present

## 2018-10-12 DIAGNOSIS — K5190 Ulcerative colitis, unspecified, without complications: Secondary | ICD-10-CM | POA: Diagnosis not present

## 2018-10-12 DIAGNOSIS — R05 Cough: Secondary | ICD-10-CM | POA: Diagnosis not present

## 2018-10-12 DIAGNOSIS — R1312 Dysphagia, oropharyngeal phase: Secondary | ICD-10-CM

## 2018-10-26 ENCOUNTER — Ambulatory Visit
Admission: RE | Admit: 2018-10-26 | Discharge: 2018-10-26 | Disposition: A | Discharge status: Home / Self Care | Payer: Medicare Other | Source: Ambulatory Visit | Attending: Gastroenterology | Admitting: Gastroenterology

## 2018-10-26 DIAGNOSIS — R1312 Dysphagia, oropharyngeal phase: Secondary | ICD-10-CM

## 2018-10-26 DIAGNOSIS — K449 Diaphragmatic hernia without obstruction or gangrene: Secondary | ICD-10-CM | POA: Diagnosis not present

## 2018-10-26 DIAGNOSIS — R05 Cough: Secondary | ICD-10-CM | POA: Diagnosis not present

## 2018-10-26 DIAGNOSIS — R053 Chronic cough: Secondary | ICD-10-CM

## 2018-11-02 DIAGNOSIS — K1370 Unspecified lesions of oral mucosa: Secondary | ICD-10-CM | POA: Diagnosis not present

## 2018-11-02 DIAGNOSIS — H4423 Degenerative myopia, bilateral: Secondary | ICD-10-CM | POA: Diagnosis not present

## 2018-11-02 DIAGNOSIS — K449 Diaphragmatic hernia without obstruction or gangrene: Secondary | ICD-10-CM | POA: Diagnosis not present

## 2018-11-02 DIAGNOSIS — Z7289 Other problems related to lifestyle: Secondary | ICD-10-CM | POA: Diagnosis not present

## 2018-11-02 DIAGNOSIS — H11823 Conjunctivochalasis, bilateral: Secondary | ICD-10-CM | POA: Diagnosis not present

## 2018-11-02 DIAGNOSIS — H2513 Age-related nuclear cataract, bilateral: Secondary | ICD-10-CM | POA: Diagnosis not present

## 2018-11-02 DIAGNOSIS — R1312 Dysphagia, oropharyngeal phase: Secondary | ICD-10-CM | POA: Diagnosis not present

## 2018-11-02 DIAGNOSIS — H43813 Vitreous degeneration, bilateral: Secondary | ICD-10-CM | POA: Diagnosis not present

## 2018-11-02 DIAGNOSIS — H1045 Other chronic allergic conjunctivitis: Secondary | ICD-10-CM | POA: Diagnosis not present

## 2018-11-02 DIAGNOSIS — K5190 Ulcerative colitis, unspecified, without complications: Secondary | ICD-10-CM | POA: Diagnosis not present

## 2018-11-02 DIAGNOSIS — H57813 Brow ptosis, bilateral: Secondary | ICD-10-CM | POA: Diagnosis not present

## 2018-11-02 DIAGNOSIS — H02423 Myogenic ptosis of bilateral eyelids: Secondary | ICD-10-CM | POA: Diagnosis not present

## 2018-11-03 DIAGNOSIS — R1312 Dysphagia, oropharyngeal phase: Secondary | ICD-10-CM | POA: Insufficient documentation

## 2018-12-27 ENCOUNTER — Telehealth: Payer: Self-pay | Admitting: Cardiovascular Disease

## 2018-12-27 MED ORDER — DILTIAZEM HCL ER 120 MG PO CP24: 120.0000 mg | ORAL_CAPSULE | Freq: Two times a day (BID) | ORAL | 3 refills | Status: DC

## 2018-12-27 NOTE — Telephone Encounter (Signed)
New Message:    Patient Dr. Sonny Dandy calling concerning a medication change. Please call patient back.

## 2018-12-27 NOTE — Telephone Encounter (Signed)
Spoke with Dr. Sonny Dandy who requests refill of diltiazem 120 mg BID. He states he increased the dose from daily to twice daily as directed by Dr. Acie Fredrickson last fall and needs to make certain he has enough pills for 90 day supply. I verified his pharmacy and he denies that he needs any additional refills. He thanked me for the call.

## 2019-01-14 ENCOUNTER — Telehealth: Payer: Self-pay

## 2019-01-14 NOTE — Telephone Encounter (Signed)
Spoke with pt and he would prefer to have a phone visit. Pt also prefers to have an afternoon appt because he works in the mornings and is off in the afternoon. I will be reaching back out to change appt date if I find any openings on Thursday afternoon for the same week or the following week, the pt is off work all week.

## 2019-01-24 ENCOUNTER — Encounter: Payer: Self-pay | Admitting: Cardiovascular Disease

## 2019-01-24 ENCOUNTER — Other Ambulatory Visit: Payer: Self-pay

## 2019-01-24 ENCOUNTER — Telehealth (INDEPENDENT_AMBULATORY_CARE_PROVIDER_SITE_OTHER): Payer: Medicare Other | Admitting: Cardiovascular Disease

## 2019-01-24 VITALS — BP 130/73 | HR 67 | Ht 65.9 in | Wt 152.8 lb

## 2019-01-24 DIAGNOSIS — Z7189 Other specified counseling: Secondary | ICD-10-CM | POA: Diagnosis not present

## 2019-01-24 DIAGNOSIS — I480 Paroxysmal atrial fibrillation: Secondary | ICD-10-CM | POA: Diagnosis not present

## 2019-01-24 NOTE — Patient Instructions (Signed)

## 2019-01-24 NOTE — Progress Notes (Signed)
Virtual Visit via Video Note   This visit type was conducted due to national recommendations for restrictions regarding the COVID-19 Pandemic (e.g. social distancing) in an effort to limit this patient's exposure and mitigate transmission in our community.  Due to his co-morbid illnesses, this patient is at least at moderate risk for complications without adequate follow up.  This format is felt to be most appropriate for this patient at this time.  All issues noted in this document were discussed and addressed.  A limited physical exam was performed with this format.  Please refer to the patient's chart for his consent to telehealth for [facility].   Date:  01/24/2019   ID:  Daniel Payne  DR Daniel Payne, DOB 1945-11-05, MRN [id number]  Patient Location: Home Provider Location: Home  PCP:  Daniel Manes, MD  Cardiologist:  Daniel Moores, MD  Electrophysiologist:  None   Evaluation Performed:  Follow-Up Visit  Chief Complaint:  Atrial fib      Daniel Payne  DR Daniel Payne is a 73 y.o. male with hx of PAF   1. Paroxysmal Atrial fib 2. Hyperlipidemia 3. Prostate surgery-status post robotic prostatectomy 4. Ulcerative colitis   Previous Notes:  Daniel Payne  DR Daniel Payne is a 72 y.o. 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  Has not fully controlled the bleeding. Best controlled by steroids.    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 He was cycling and went into rapid Afib with rate of 170.  He converted back to NSR in 3-4 hours. Had another episode of Afib while working in his garden.  Went to Dr.  Carlyle Payne office . Afib was documeneted.  Was started on Dilt 30 QID.  Converted to NSR the next day He stopped the Dilt a week or so later.  Has developed episodes of sinus tachycardia recently.  Also associated with some chest tightness.  Does not last  long.   This am he had very slight chest tightness while he was walking.  Lasted 5  Minutes.  No diaphoresis, no radiation.  Drinks wine with dinner.  02/08/2015:  Skin is doing well. He has paroxysmal atrial fibrillation. He has not wanted to start anticoagulation yet. He's had some problems with colitis. He had a stress Myoview study as part of his workup for some vague chest tightness. Myoview revealed no evidence of ischemia. He had normal left ventricle systolic function with an ejection fraction of 75%. Has been started on Humira for Ulcerative colits . Has been cycling quite a bit.  No issues.   No recurrent episodes of atrial fib .  He thinks it may have been related to the high dose prednisone   Sept. 7, 2016:  Daniel Payne is doing well. He stopped his Eliquis  Doing well.  Cycling regularly .  November 14, 2015:   Doing well.  Still cycling .    Just got back from [facility] for snow skiing .  Daniel Payne, Daniel Payne, )  Very active .  His UC is well controlled  November 26, 2016  Daniel Payne is doing well Spent the past 8 days skiing our in [place] 20-25 days a year now  Does well even at high altitude. Also hiked last year in [place] without any problems   Jan 06, 2018 :  Daniel Payne is seen today for follow-up of  his atrial fibrillation and hyperlipidemia.  He has maintained normal sinus rhythm. Very active.  Skis quite a bit .  Walks 2 -3 miles with the dogs 4 days . Rides 15-20 miles on his bike once a week . No palpitations  Doing well on the Dilt LA 120 mg a day   June 30, 2018:  Daniel Payne is seen as a work in visit today.  He was recently on a trans -atlantic  flight and was awake for 26 hours.  He went into atrial for ablation.  He tried an extra half a diltiazem tablet but he remains in atrial fib .   Slept well but has not converted to NSR   He is planning on going up to his not In an [place], [place] for the weekend.  Jan 24, 2019  Daniel Payne is seen  today by video chat. 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 Daniel Payne to ski many times a year. Has UC so is not on DOAC routinely but has eliquis to take for his episodes of PAF.  Had an episode of PAF that lasted several days , resolved with additional Diltiazem and eliquis      The patient does not have symptoms concerning for COVID-19 infection (fever, chills, cough, or new shortness of breath).    Past Medical History:  Diagnosis Date  . A-fib ([place])   . ADHD (attention deficit hyperactivity disorder)   . Cancer [facility])    prostate dr. Chriss Czar Payne  . Chronic kidney disease    borderline stage III  . Fissure, anal   . Giardia    working in [place]  . Hyperlipidemia   . Infectious mononucleosis   . Low back pain   . Presbycusis of left ear   . Spinal stenosis of lumbar region   . Ulcerative colitis Daniel Payne)    Past Surgical History:  Procedure Laterality Date  . ANAL FISSURE REPAIR    . APPENDECTOMY    . PROSTATE SURGERY    . ROTATOR CUFF REPAIR Left   . VARICOCELE EXCISION Right      Current Meds  Medication Sig  . acetaminophen (TYLENOL) 500 MG tablet Take 500 mg by mouth at bedtime.   Marland Kitchen atorvastatin (LIPITOR) 10 MG tablet Take 10 mg by mouth daily.  Marland Kitchen diltiazem (CARDIZEM) 30 MG tablet Take 1 tablet (30 mg total) by mouth as needed. May take in addition if has afib episode  . diltiazem (DILACOR XR) 120 MG 24 hr capsule Take 1 capsule (120 mg total) by mouth 2 (two) times daily.  . diphenhydrAMINE (BENADRYL) 25 MG tablet Take 25 mg by mouth at bedtime.   Marland Kitchen HUMIRA PEN 40 MG/0.8ML PNKT Inject as directed. EVERY 2 WEEKS  . Melatonin 5 MG TABS Take 1 tablet by mouth at bedtime.  . naproxen sodium (ALEVE) 220 MG tablet Take 440 mg by mouth daily as needed.     Allergies:   Concerta [methylphenidate]; Hydrocortisone; and Lialda [mesalamine]   Social History   Tobacco Use  . Smoking status: Never Smoker  . Smokeless tobacco: Never Used  Substance Use  Topics  . Alcohol use: Yes    Alcohol/week: 0.0 standard drinks    Comment: 1-2 glasses of red wine  . Drug use: No     Family Hx: The patient's family history includes Heart attack in his father; Stroke (age of onset: 53) in his mother.  ROS:   Please see the history of  present illness.     All other systems reviewed and are negative.   Prior CV studies:   The following studies were reviewed today:    Labs/Other Tests and Data Reviewed:    EKG:  No ECG reviewed.  Recent Labs: 06/30/2018: BUN 20; Creatinine, Ser 1.21; Hemoglobin 16.8; Platelets 235; Potassium 4.7; Sodium 138   Recent Lipid Panel No results found for: CHOL, TRIG, HDL, CHOLHDL, LDLCALC, LDLDIRECT  Wt Readings from Last 3 Encounters:  01/24/19 152 lb 12.8 oz (69.3 kg)  07/29/18 154 lb (69.9 kg)  06/30/18 154 lb (69.9 kg)     Objective:    Vital Signs:  BP 130/73   Pulse 67   Ht 5' 5.9" (1.674 m)   Wt 152 lb 12.8 oz (69.3 kg)   SpO2 99%   BMI 24.74 kg/m    VITAL SIGNS:  reviewed GEN:  no acute distress EYES:  sclerae anicteric, EOMI - Extraocular Movements Intact RESPIRATORY:  normal respiratory effort, symmetric expansion CARDIOVASCULAR:  no peripheral edema SKIN:  no rash, lesions or ulcers. MUSCULOSKELETAL:  no obvious deformities. NEURO:  alert and oriented x 3, no obvious focal deficit PSYCH:  normal affect  ASSESSMENT & PLAN:    Paroxysmal atrial fibrillation: Daniel Payne seems to be doing very well.  He has very rare episodes of paroxysmal atrial fibrillation.  He is able to start Eliquis within 30 minutes of any onset of atrial fibrillation.  He takes extra diltiazem and usually is able to convert himself back to sinus rhythm within several days.  He does not want to be on Eliquis long-term because he is very active with his cycling and skiing and does not want to risk having traumatic head injury.  He is satisfied with knowing that he can tell when he is in atrial fibrillation and he starts  Eliquis right away.  If he has a prolonged episode of atrial she, he continues Eliquis for 3 weeks following converting back to sinus rhythm.  I will see him again in 6 months-likely by  virtual visit again.  He will call for any questions.   COVID-19 Education: The signs and symptoms of COVID-19 were discussed with the patient and how to seek care for testing (follow up with PCP or arrange E-visit).  The importance of social distancing was discussed today.  Time:   Today, I have spent  17  minutes with the patient with telehealth technology discussing the above problems.     Medication Adjustments/Labs and Tests Ordered: Current medicines are reviewed at length with the patient today.  Concerns regarding medicines are outlined above.   Tests Ordered: No orders of the defined types were placed in this encounter.   Medication Changes: No orders of the defined types were placed in this encounter.   Disposition:  Follow up in 6 month(s)  Signed, Daniel Moores, MD  01/24/2019 4:32 PM    [facility]

## 2019-01-25 ENCOUNTER — Ambulatory Visit: Payer: Medicare Other | Admitting: Cardiovascular Disease

## 2019-02-16 DIAGNOSIS — M48061 Spinal stenosis, lumbar region without neurogenic claudication: Secondary | ICD-10-CM | POA: Diagnosis not present

## 2019-02-16 DIAGNOSIS — M4306 Spondylolysis, lumbar region: Secondary | ICD-10-CM | POA: Diagnosis not present

## 2019-04-27 DIAGNOSIS — E7800 Pure hypercholesterolemia, unspecified: Secondary | ICD-10-CM | POA: Diagnosis not present

## 2019-04-27 DIAGNOSIS — K5190 Ulcerative colitis, unspecified, without complications: Secondary | ICD-10-CM | POA: Diagnosis not present

## 2019-04-27 DIAGNOSIS — Z0000 Encounter for general adult medical examination without abnormal findings: Secondary | ICD-10-CM | POA: Diagnosis not present

## 2019-04-27 DIAGNOSIS — D6869 Other thrombophilia: Secondary | ICD-10-CM | POA: Diagnosis not present

## 2019-04-27 DIAGNOSIS — Z8546 Personal history of malignant neoplasm of prostate: Secondary | ICD-10-CM | POA: Diagnosis not present

## 2019-04-27 DIAGNOSIS — F900 Attention-deficit hyperactivity disorder, predominantly inattentive type: Secondary | ICD-10-CM | POA: Diagnosis not present

## 2019-04-27 DIAGNOSIS — Z1389 Encounter for screening for other disorder: Secondary | ICD-10-CM | POA: Diagnosis not present

## 2019-04-27 DIAGNOSIS — N183 Chronic kidney disease, stage 3 (moderate): Secondary | ICD-10-CM | POA: Diagnosis not present

## 2019-04-27 DIAGNOSIS — Z79899 Other long term (current) drug therapy: Secondary | ICD-10-CM | POA: Diagnosis not present

## 2019-04-27 DIAGNOSIS — I480 Paroxysmal atrial fibrillation: Secondary | ICD-10-CM | POA: Diagnosis not present

## 2019-05-19 ENCOUNTER — Ambulatory Visit (INDEPENDENT_AMBULATORY_CARE_PROVIDER_SITE_OTHER): Payer: Medicare Other | Admitting: Podiatry

## 2019-05-19 ENCOUNTER — Other Ambulatory Visit: Payer: Self-pay

## 2019-05-19 ENCOUNTER — Encounter: Payer: Self-pay | Admitting: Podiatry

## 2019-05-19 DIAGNOSIS — E7800 Pure hypercholesterolemia, unspecified: Secondary | ICD-10-CM | POA: Insufficient documentation

## 2019-05-19 DIAGNOSIS — K5150 Left sided colitis without complications: Secondary | ICD-10-CM | POA: Insufficient documentation

## 2019-05-19 DIAGNOSIS — F988 Other specified behavioral and emotional disorders with onset usually occurring in childhood and adolescence: Secondary | ICD-10-CM | POA: Insufficient documentation

## 2019-05-19 DIAGNOSIS — C61 Malignant neoplasm of prostate: Secondary | ICD-10-CM | POA: Insufficient documentation

## 2019-05-19 DIAGNOSIS — R000 Tachycardia, unspecified: Secondary | ICD-10-CM | POA: Insufficient documentation

## 2019-05-19 DIAGNOSIS — L600 Ingrowing nail: Secondary | ICD-10-CM

## 2019-05-19 MED ORDER — MUPIROCIN 2 % EX OINT: TOPICAL_OINTMENT | CUTANEOUS | 1 refills | Status: DC

## 2019-05-19 NOTE — Patient Instructions (Signed)

## 2019-05-21 ENCOUNTER — Encounter: Payer: Self-pay | Admitting: Podiatry

## 2019-05-21 NOTE — Progress Notes (Signed)
Subjective:  Patient ID: Daniel Payne, male    DOB: February 22, 1946,  MRN: [id number] HPI Chief Complaint  Patient presents with  . Toe Pain    Hallux right - tender at times at lateral border x years intermittent, nail is thick, has trouble cutting it  . New Patient (Initial Visit)    73 y.o. male presents with the above complaint.   ROS: Denies fever chills nausea vomiting muscle aches pains calf pain back pain chest pain shortness of breath.  Past Medical History:  Diagnosis Date  . A-fib ([place])   . ADHD (attention deficit hyperactivity disorder)   . Cancer [facility])    prostate dr. Chriss Czar davis  . Chronic kidney disease    borderline stage III  . Fissure, anal   . Giardia    working in [place]  . Hyperlipidemia   . Infectious mononucleosis   . Low back pain   . Presbycusis of left ear   . Spinal stenosis of lumbar region   . Ulcerative colitis [facility])    Past Surgical History:  Procedure Laterality Date  . ANAL FISSURE REPAIR    . APPENDECTOMY    . PROSTATE SURGERY    . ROTATOR CUFF REPAIR Left   . VARICOCELE EXCISION Right     Current Outpatient Medications:  .  acetaminophen (TYLENOL) 500 MG tablet, Take 500 mg by mouth at bedtime. , Disp: , Rfl:  .  atorvastatin (LIPITOR) 10 MG tablet, Take 10 mg by mouth daily., Disp: , Rfl: 11 .  diltiazem (CARDIZEM) 30 MG tablet, Take 1 tablet (30 mg total) by mouth as needed. May take in addition if has afib episode, Disp: 30 tablet, Rfl: 3 .  diltiazem (DILACOR XR) 120 MG 24 hr capsule, Take 1 capsule (120 mg total) by mouth 2 (two) times daily., Disp: 180 capsule, Rfl: 3 .  diphenhydrAMINE (BENADRYL) 25 MG tablet, Take 25 mg by mouth at bedtime. , Disp: , Rfl:  .  HUMIRA PEN 40 MG/0.8ML PNKT, Inject as directed. EVERY 2 WEEKS, Disp: , Rfl: 11 .  Melatonin 5 MG TABS, Take 1 tablet by mouth at bedtime., Disp: , Rfl:  .  mupirocin ointment (BACTROBAN) 2 %, Apply to wound after soaking BID, Disp: 30 g, Rfl: 1 .  naproxen sodium  (ALEVE) 220 MG tablet, Take 440 mg by mouth daily as needed., Disp: , Rfl:   Allergies  Allergen Reactions  . Concerta [Methylphenidate] Other (See Comments)    Burning mouth  . Hydrocortisone Itching    topical  . Lialda [Mesalamine] Diarrhea   Review of Systems Objective:   Vitals:   05/19/19 1535  BP: 118/64  Pulse: 62  Resp: 16    General: Well developed, nourished, in no acute distress, alert and oriented x3   Dermatological: Skin is warm, dry and supple bilateral. Nails x 10 are well maintained; remaining integument appears unremarkable at this time. There are no open sores, no preulcerative lesions, no rash or signs of infection present.  Sharp incurvated nail margin fibular border hallux right he states that the nail is thickened he cannot cut it he also has some other nails that are similar.  Vascular: Dorsalis Pedis artery and Posterior Tibial artery pedal pulses are 2/4 bilateral with immedate capillary fill time. Pedal hair growth present. No varicosities and no lower extremity edema present bilateral.   Neruologic: Grossly intact via light touch bilateral. Vibratory intact via tuning fork bilateral. Protective threshold with Semmes Wienstein monofilament intact  to all pedal sites bilateral. Patellar and Achilles deep tendon reflexes 2+ bilateral. No Babinski or clonus noted bilateral.   Musculoskeletal: No gross boney pedal deformities bilateral. No pain, crepitus, or limitation noted with foot and ankle range of motion bilateral. Muscular strength 5/5 in all groups tested bilateral.  Gait: Unassisted, Nonantalgic.    Radiographs:  None taken  Assessment & Plan:   Assessment: Ingrown nail paronychia nail dystrophy.  Plan: Chemical matrixectomy was performed he tolerated procedure well without complications.  Was provided with both oral and written home-going instructions for care and soaking of the toe as well as a prescription for Cortisporin Otic to be applied  twice daily after soaking.  I will follow-up with him in 2 weeks.     Max T. [place], [place]

## 2019-05-23 ENCOUNTER — Telehealth: Payer: Self-pay | Admitting: Podiatry

## 2019-05-23 ENCOUNTER — Telehealth: Payer: Self-pay | Admitting: *Deleted

## 2019-05-23 NOTE — Telephone Encounter (Signed)
I called Dr. Sonny Dandy and informed that it was not unusual to have redness and tenderness this close to the procedure date, but the symptoms should decrease the further from the surgery date he was. Pt states he did get the mupirocin and is using it.

## 2019-05-23 NOTE — Telephone Encounter (Signed)
Partial nail removal last Thursday. The base of the nail is now red and painful. Needs to know if he needs to come in.

## 2019-05-23 NOTE — Telephone Encounter (Signed)
Pt states he had the lateral edge of the right 1st toenail removed by Dr. Milinda Pointer Thursday and now has erythema and tenderness at the base.

## 2019-05-26 ENCOUNTER — Ambulatory Visit: Payer: Medicare Other | Admitting: Podiatry

## 2019-06-02 ENCOUNTER — Ambulatory Visit: Payer: Medicare Other

## 2019-06-02 ENCOUNTER — Other Ambulatory Visit: Payer: Self-pay

## 2019-06-02 DIAGNOSIS — L600 Ingrowing nail: Secondary | ICD-10-CM

## 2019-06-07 DIAGNOSIS — Z23 Encounter for immunization: Secondary | ICD-10-CM | POA: Diagnosis not present

## 2019-06-14 NOTE — Progress Notes (Signed)
Patient is here today for follow-up appointment, recent procedure performed on 05/19/2019 removal of ingrown toenail.  He states that he is not having pain or any complications with the toe at this time.  No redness, no swelling, no erythema, no drainage, no other signs and symptoms of infection.  Discussed signs and symptoms of infection, verbal and written instructions were given.  He is to follow-up as needed with any acute symptom changes.

## 2019-06-20 DIAGNOSIS — M48061 Spinal stenosis, lumbar region without neurogenic claudication: Secondary | ICD-10-CM | POA: Diagnosis not present

## 2019-06-20 DIAGNOSIS — R030 Elevated blood-pressure reading, without diagnosis of hypertension: Secondary | ICD-10-CM | POA: Diagnosis not present

## 2019-06-29 DIAGNOSIS — M5136 Other intervertebral disc degeneration, lumbar region: Secondary | ICD-10-CM | POA: Diagnosis not present

## 2019-06-29 DIAGNOSIS — M4306 Spondylolysis, lumbar region: Secondary | ICD-10-CM | POA: Diagnosis not present

## 2019-06-29 DIAGNOSIS — M48061 Spinal stenosis, lumbar region without neurogenic claudication: Secondary | ICD-10-CM | POA: Diagnosis not present

## 2019-07-06 DIAGNOSIS — D1801 Hemangioma of skin and subcutaneous tissue: Secondary | ICD-10-CM | POA: Diagnosis not present

## 2019-07-06 DIAGNOSIS — L814 Other melanin hyperpigmentation: Secondary | ICD-10-CM | POA: Diagnosis not present

## 2019-07-06 DIAGNOSIS — D225 Melanocytic nevi of trunk: Secondary | ICD-10-CM | POA: Diagnosis not present

## 2019-07-06 DIAGNOSIS — L821 Other seborrheic keratosis: Secondary | ICD-10-CM | POA: Diagnosis not present

## 2019-08-16 DIAGNOSIS — M47816 Spondylosis without myelopathy or radiculopathy, lumbar region: Secondary | ICD-10-CM | POA: Diagnosis not present

## 2019-09-15 DIAGNOSIS — Z23 Encounter for immunization: Secondary | ICD-10-CM | POA: Diagnosis not present

## 2019-10-11 DIAGNOSIS — M47816 Spondylosis without myelopathy or radiculopathy, lumbar region: Secondary | ICD-10-CM | POA: Diagnosis not present

## 2019-10-12 ENCOUNTER — Ambulatory Visit (INDEPENDENT_AMBULATORY_CARE_PROVIDER_SITE_OTHER): Payer: Medicare Other | Admitting: Orthopaedic Surgery

## 2019-10-12 ENCOUNTER — Ambulatory Visit (INDEPENDENT_AMBULATORY_CARE_PROVIDER_SITE_OTHER): Payer: Medicare Other

## 2019-10-12 ENCOUNTER — Other Ambulatory Visit: Payer: Self-pay

## 2019-10-12 ENCOUNTER — Encounter: Payer: Self-pay | Admitting: Orthopaedic Surgery

## 2019-10-12 VITALS — Ht 69.5 in | Wt 148.0 lb

## 2019-10-12 DIAGNOSIS — M25512 Pain in left shoulder: Secondary | ICD-10-CM

## 2019-10-12 DIAGNOSIS — G8929 Other chronic pain: Secondary | ICD-10-CM | POA: Diagnosis not present

## 2019-10-12 MED ORDER — BUPIVACAINE HCL 0.5 % IJ SOLN
2.0000 mL | INTRAMUSCULAR | Status: AC | PRN
  Administered 2019-10-12: 2 mL via INTRA_ARTICULAR

## 2019-10-12 MED ORDER — LIDOCAINE HCL 1 % IJ SOLN
2.0000 mL | INTRAMUSCULAR | Status: AC | PRN
  Administered 2019-10-12: 2 mL

## 2019-10-12 MED ORDER — METHYLPREDNISOLONE ACETATE 40 MG/ML IJ SUSP
80.0000 mg | INTRAMUSCULAR | Status: AC | PRN
  Administered 2019-10-12: 80 mg via INTRA_ARTICULAR

## 2019-10-12 NOTE — Progress Notes (Signed)
Office Visit Note   Patient: Daniel Payne           Date of Birth: 1945-12-16           MRN: [id number] Visit Date: 10/12/2019              Requested by: Daniel Manes, MD [address] [place],  [place] [id number] PCP: Daniel Manes, MD   Assessment & Plan: Visit Diagnoses:  1. Acute pain of left shoulder   2. Chronic left shoulder pain     Plan: Daniel Payne has been experiencing mostly posterior left shoulder pain for several months.  No specific injury or trauma.  Does have some difficulty playing golf and sleeping on his side.  Discomfort appears to be predominantly posteriorly without any obvious anterior discomfort.  No numbness or tingling.  No neck pain.  Negative impingement.  We will try an intra-articular cortisone injection and monitor response.  This was performed posteriorly  Follow-Up Instructions: Return if symptoms worsen or fail to improve.   Orders:  Orders Placed This Encounter  Procedures  . Large Joint Inj: L glenohumeral  . XR Shoulder Left   No orders of the defined types were placed in this encounter.     Procedures: Large Joint Inj: L glenohumeral on 10/12/2019 4:38 PM Details: 25 G 1.5 in needle, posterior approach  Arthrogram: No  Medications: 2 mL lidocaine 1 %; 2 mL bupivacaine 0.5 %; 80 mg methylPREDNISolone acetate 40 MG/ML Procedure, treatment alternatives, risks and benefits explained, specific risks discussed. Consent was given by the patient. Immediately prior to procedure a time out was called to verify the correct patient, procedure, equipment, support staff and site/side marked as required. Patient was prepped and draped in the usual sterile fashion.       Clinical Data: No additional findings.   Subjective: Chief Complaint  Patient presents with  . Left Shoulder - Pain  Patient presents today for left shoulder pain. He said that it has been hurting for 6 months. No known injury. He has a history of bilateral  rotator cuff surgery several years ago. His pain is located laterally and hurts to lay on that side at night or if he reaches across his body. He takes tylenol. No numbness, tingling, or weakness.  Having difficulty sleeping on that side  HPI  Review of Systems   Objective: Vital Signs: Ht 5' 9.5" (1.765 m)   Wt 148 lb (67.1 kg)   BMI 21.54 kg/m   Physical Exam Constitutional:      Appearance: He is well-developed.  Eyes:     Pupils: Pupils are equal, round, and reactive to light.  Pulmonary:     Effort: Pulmonary effort is normal.  Skin:    General: Skin is warm and dry.  Neurological:     Mental Status: He is alert and oriented to person, place, and time.  Psychiatric:        Behavior: Behavior normal.     Ortho Exam awake alert and oriented x3.  Comfortable sitting.  Able to place his left arm overhead quickly without any problem.  Negative impingement.  Minimally positive empty can testing.  Great strength.  Good grip and release.  Does have some pain along the posterior glenohumeral joint.  No masses.  No pain at the acromioclavicular joint.  Biceps intact .no grinding or grating with glenohumeral motion  Specialty Comments:  No specialty comments available.  Imaging: XR Shoulder Left  Result Date: 10/12/2019 Films of the left shoulder obtained in several projections.  There is some ectopic calcification along the inferior glenoid which may represent osteophyte formation.  No osteophytes about the humeral head.  The joint space appears to be well-maintained.  Normal space between the humeral head and the acromium.  No acute changes    PMFS History: Patient Active Problem List   Diagnosis Date Noted  . Pain in left shoulder 10/12/2019  . ADD (attention deficit disorder) 05/19/2019  . CA of prostate ([place]) 05/19/2019  . Hypercholesterolemia without hypertriglyceridemia 05/19/2019  . Left sided colitis without complications ([place]) 39/53/2023  . Tachycardia 05/19/2019    . Oropharyngeal dysphagia 11/03/2018  . PAF (paroxysmal atrial fibrillation) ([place]) 11/07/2014   Past Medical History:  Diagnosis Date  . A-fib ([place])   . ADHD (attention deficit hyperactivity disorder)   . Cancer [facility])    prostate dr. Chriss Czar davis  . Chronic kidney disease    borderline stage III  . Fissure, anal   . Giardia    working in [place]  . Hyperlipidemia   . Infectious mononucleosis   . Low back pain   . Presbycusis of left ear   . Spinal stenosis of lumbar region   . Ulcerative colitis ([place])     Family History  Problem Relation Age of Onset  . Stroke Mother 68  . Heart attack Father     Past Surgical History:  Procedure Laterality Date  . ANAL FISSURE REPAIR    . APPENDECTOMY    . PROSTATE SURGERY    . ROTATOR CUFF REPAIR Left   . VARICOCELE EXCISION Right    Social History   Occupational History  . Not on file  Tobacco Use  . Smoking status: Never Smoker  . Smokeless tobacco: Never Used  Substance and Sexual Activity  . Alcohol use: Yes    Alcohol/week: 0.0 standard drinks    Comment: 1-2 glasses of red wine  . Drug use: No  . Sexual activity: Not on file

## 2019-10-13 DIAGNOSIS — Z23 Encounter for immunization: Secondary | ICD-10-CM | POA: Diagnosis not present

## 2019-10-18 DIAGNOSIS — M47816 Spondylosis without myelopathy or radiculopathy, lumbar region: Secondary | ICD-10-CM | POA: Diagnosis not present

## 2019-10-24 ENCOUNTER — Telehealth: Payer: Self-pay | Admitting: Cardiovascular Disease

## 2019-10-24 MED ORDER — DILTIAZEM HCL 30 MG PO TABS: 30.0000 mg | ORAL_TABLET | ORAL | 2 refills | Status: DC | PRN

## 2019-10-24 MED ORDER — APIXABAN 5 MG PO TABS: 5.0000 mg | ORAL_TABLET | Freq: Two times a day (BID) | ORAL | 11 refills | Status: DC

## 2019-10-24 NOTE — Telephone Encounter (Signed)
Spoke with patient's wife who states patient has Eliquis from previous Rx that is one year old and he would like to renew the Rx because he keeps it on hand in the event that he has a fib. He is going skiing in [name] at the end of the month and would like to take the Eliquis with him. I advised his wife that I will forward to Dr. Acie Fredrickson for agreement and advised patient will need to call back to schedule a yearly follow-up for April or May. She verbalized understanding and agreement and thanked me for the call.

## 2019-10-24 NOTE — Telephone Encounter (Signed)
Pt calling stating that he would like Dr. Acie Fredrickson or his nurse Sharyn Lull, RN to give him a call back concerning his medication Eliquis. Pt states that he has medication of Eliquis, that is over a year old would like to know if he could still use it. This medication is no longer on pt's medication list. Please address

## 2019-10-24 NOTE — Telephone Encounter (Signed)
I agree with the note by Christen Bame, RN and the plan for Dr. Sonny Dandy to have  Eliquis on hand for his atrial fib Please renew

## 2019-11-03 ENCOUNTER — Encounter: Payer: Self-pay | Admitting: Orthopedic Surgery

## 2019-11-03 ENCOUNTER — Ambulatory Visit (INDEPENDENT_AMBULATORY_CARE_PROVIDER_SITE_OTHER): Payer: Medicare Other | Admitting: Orthopedic Surgery

## 2019-11-03 ENCOUNTER — Other Ambulatory Visit: Payer: Self-pay

## 2019-11-03 VITALS — Ht 69.0 in | Wt 148.0 lb

## 2019-11-03 DIAGNOSIS — M722 Plantar fascial fibromatosis: Secondary | ICD-10-CM

## 2019-11-10 ENCOUNTER — Encounter: Payer: Self-pay | Admitting: Orthopedic Surgery

## 2019-11-10 DIAGNOSIS — M722 Plantar fascial fibromatosis: Secondary | ICD-10-CM

## 2019-11-10 MED ORDER — LIDOCAINE HCL 1 % IJ SOLN
2.0000 mL | INTRAMUSCULAR | Status: AC | PRN
  Administered 2019-11-10: 2 mL

## 2019-11-10 MED ORDER — METHYLPREDNISOLONE ACETATE 40 MG/ML IJ SUSP
40.0000 mg | INTRAMUSCULAR | Status: AC | PRN
  Administered 2019-11-10: 40 mg

## 2019-11-10 NOTE — Progress Notes (Signed)
Office Visit Note   Patient: Daniel Payne           Date of Birth: July 05, 1946           MRN: [id number] Visit Date: 11/03/2019              Requested by: Lajean Manes, MD [address] [place],   [id number] PCP: Lajean Manes, MD  Chief Complaint  Patient presents with  . Right Foot - Pain      HPI: Patient is a 74 year old gentleman who presents complaining plantar fasciitis.  He is active with sports and has pain with start up and pain with prolonged activities.  Patient states he started prednisone 10 mg twice a day which have improved his symptoms a little.  Assessment & Plan: Visit Diagnoses:  1. Plantar fasciitis, right     Plan: Patient underwent a plantar fascial injection he was given instructions for Achilles stretching orthotics follow-up as needed  Follow-Up Instructions: Return if symptoms worsen or fail to improve.   Ortho Exam  Patient is alert, oriented, no adenopathy, well-dressed, normal affect, normal respiratory effort. Examination patient has good pulses he has dorsiflexion to neutral lateral compression of the calcaneus is nontender the peroneal posterior tibial tendon and Achilles tendon are nontender to palpation.  He is point tender to palpation the origin of the plantar fascia.  Imaging: No results found. No images are attached to the encounter.  Labs: No results found for: HGBA1C, ESRSEDRATE, CRP, LABURIC, REPTSTATUS, GRAMSTAIN, CULT, LABORGA   Lab Results  Component Value Date   ALBUMIN 3.7 03/05/2007    No results found for: MG No results found for: VD25OH  No results found for: PREALBUMIN CBC EXTENDED Latest Ref Rng & Units 06/30/2018 03/01/2015 11/30/2009  WBC 3.4 - 10.8 x10E3/uL 10.5 13.3(H) -  RBC 4.14 - 5.80 x10E6/uL 5.29 4.99 -  HGB 13.0 - 17.7 g/dL 16.8 16.3 17.1(H)  HCT 37.5 - 51.0 % 47.9 48.3 -  PLT 150 - 450 x10E3/uL 235 223.0 -  NEUTROABS 1.4 - 7.7 K/uL - 11.1(H) -  LYMPHSABS 0.7 - 4.0 K/uL -  1.5 -     Body mass index is 21.86 kg/m.  Orders:  No orders of the defined types were placed in this encounter.  No orders of the defined types were placed in this encounter.    Procedures: Foot Inj  Date/Time: 11/10/2019 8:09 AM Performed by: Newt Minion, MD Authorized by: Newt Minion, MD   Consent Given by:  Patient Site marked: the procedure site was marked   Timeout: prior to procedure the correct patient, procedure, and site was verified   Indications:  Fasciitis and pain Condition: Plantar Fasciitis   Location: right plantar fascia muscle   Prep: patient was prepped and draped in usual sterile fashion   Needle Size:  22 G Approach:  Plantar Medications:  2 mL lidocaine 1 %; 40 mg methylPREDNISolone acetate 40 MG/ML Patient Tolerance:  Patient tolerated the procedure well with no immediate complications    Clinical Data: No additional findings.  ROS:  All other systems negative, except as noted in the HPI. Review of Systems  Objective: Vital Signs: Ht 5' 9"  (1.753 m)   Wt 148 lb (67.1 kg)   BMI 21.86 kg/m   Specialty Comments:  No specialty comments available.  PMFS History: Patient Active Problem List   Diagnosis Date Noted  . Pain in left shoulder 10/12/2019  . ADD (attention  deficit disorder) 05/19/2019  . CA of prostate ([place]) 05/19/2019  . Hypercholesterolemia without hypertriglyceridemia 05/19/2019  . Left sided colitis without complications ([place]) 58/05/9832  . Tachycardia 05/19/2019  . Oropharyngeal dysphagia 11/03/2018  . PAF (paroxysmal atrial fibrillation) ([place]) 11/07/2014   Past Medical History:  Diagnosis Date  . A-fib ([place])   . ADHD (attention deficit hyperactivity disorder)   . Cancer [facility])    prostate dr. Chriss Czar davis  . Chronic kidney disease    borderline stage III  . Fissure, anal   . Giardia    working in [place]  . Hyperlipidemia   . Infectious mononucleosis   . Low back pain   . Presbycusis of left ear   . Spinal  stenosis of lumbar region   . Ulcerative colitis ([place])     Family History  Problem Relation Age of Onset  . Stroke Mother 22  . Heart attack Father     Past Surgical History:  Procedure Laterality Date  . ANAL FISSURE REPAIR    . APPENDECTOMY    . PROSTATE SURGERY    . ROTATOR CUFF REPAIR Left   . VARICOCELE EXCISION Right    Social History   Occupational History  . Not on file  Tobacco Use  . Smoking status: Never Smoker  . Smokeless tobacco: Never Used  Substance and Sexual Activity  . Alcohol use: Yes    Alcohol/week: 0.0 standard drinks    Comment: 1-2 glasses of red wine  . Drug use: No  . Sexual activity: Not on file

## 2019-11-14 DIAGNOSIS — Z1159 Encounter for screening for other viral diseases: Secondary | ICD-10-CM | POA: Diagnosis not present

## 2019-11-16 DIAGNOSIS — K5289 Other specified noninfective gastroenteritis and colitis: Secondary | ICD-10-CM | POA: Diagnosis not present

## 2019-11-16 DIAGNOSIS — D125 Benign neoplasm of sigmoid colon: Secondary | ICD-10-CM | POA: Diagnosis not present

## 2019-11-16 DIAGNOSIS — D122 Benign neoplasm of ascending colon: Secondary | ICD-10-CM | POA: Diagnosis not present

## 2019-11-16 DIAGNOSIS — K5150 Left sided colitis without complications: Secondary | ICD-10-CM | POA: Diagnosis not present

## 2019-11-16 DIAGNOSIS — K5730 Diverticulosis of large intestine without perforation or abscess without bleeding: Secondary | ICD-10-CM | POA: Diagnosis not present

## 2019-11-16 DIAGNOSIS — K648 Other hemorrhoids: Secondary | ICD-10-CM | POA: Diagnosis not present

## 2019-11-18 DIAGNOSIS — D122 Benign neoplasm of ascending colon: Secondary | ICD-10-CM | POA: Diagnosis not present

## 2019-11-18 DIAGNOSIS — K5289 Other specified noninfective gastroenteritis and colitis: Secondary | ICD-10-CM | POA: Diagnosis not present

## 2019-11-18 DIAGNOSIS — D125 Benign neoplasm of sigmoid colon: Secondary | ICD-10-CM | POA: Diagnosis not present

## 2019-12-06 DIAGNOSIS — H1045 Other chronic allergic conjunctivitis: Secondary | ICD-10-CM | POA: Diagnosis not present

## 2019-12-06 DIAGNOSIS — H11823 Conjunctivochalasis, bilateral: Secondary | ICD-10-CM | POA: Diagnosis not present

## 2019-12-06 DIAGNOSIS — H43813 Vitreous degeneration, bilateral: Secondary | ICD-10-CM | POA: Diagnosis not present

## 2019-12-06 DIAGNOSIS — H57813 Brow ptosis, bilateral: Secondary | ICD-10-CM | POA: Diagnosis not present

## 2019-12-06 DIAGNOSIS — H2513 Age-related nuclear cataract, bilateral: Secondary | ICD-10-CM | POA: Diagnosis not present

## 2019-12-06 DIAGNOSIS — H4423 Degenerative myopia, bilateral: Secondary | ICD-10-CM | POA: Diagnosis not present

## 2019-12-06 DIAGNOSIS — H02423 Myogenic ptosis of bilateral eyelids: Secondary | ICD-10-CM | POA: Diagnosis not present

## 2019-12-20 DIAGNOSIS — K5120 Ulcerative (chronic) proctitis without complications: Secondary | ICD-10-CM | POA: Diagnosis not present

## 2019-12-20 DIAGNOSIS — I4891 Unspecified atrial fibrillation: Secondary | ICD-10-CM | POA: Diagnosis not present

## 2019-12-30 DIAGNOSIS — K5120 Ulcerative (chronic) proctitis without complications: Secondary | ICD-10-CM | POA: Diagnosis not present

## 2020-01-04 ENCOUNTER — Telehealth: Payer: Self-pay | Admitting: Cardiovascular Disease

## 2020-01-04 MED ORDER — DILTIAZEM HCL ER 120 MG PO CP24: 120.0000 mg | ORAL_CAPSULE | Freq: Two times a day (BID) | ORAL | 0 refills | Status: DC

## 2020-01-04 NOTE — Telephone Encounter (Signed)
Pt's medication was sent to pt's pharmacy as requested. Confirmation received.  °

## 2020-01-04 NOTE — Telephone Encounter (Signed)
New Message    *STAT* If patient is at the pharmacy, call can be transferred to refill team.   1. Which medications need to be refilled? (please list name of each medication and dose if known) diltiazem (DILACOR XR) 120 MG 24 hr capsule  2. Which pharmacy/location (including street and city if local pharmacy) is medication to be sent to? [place], [place]  3. Do they need a 30 day or 90 day supply? [place]

## 2020-01-10 ENCOUNTER — Other Ambulatory Visit: Payer: Self-pay

## 2020-01-10 ENCOUNTER — Ambulatory Visit (INDEPENDENT_AMBULATORY_CARE_PROVIDER_SITE_OTHER): Payer: Medicare Other | Admitting: Cardiovascular Disease

## 2020-01-10 ENCOUNTER — Encounter: Payer: Self-pay | Admitting: Cardiovascular Disease

## 2020-01-10 VITALS — BP 116/68 | HR 73 | Ht 69.0 in | Wt 150.0 lb

## 2020-01-10 DIAGNOSIS — E782 Mixed hyperlipidemia: Secondary | ICD-10-CM | POA: Diagnosis not present

## 2020-01-10 DIAGNOSIS — I480 Paroxysmal atrial fibrillation: Secondary | ICD-10-CM

## 2020-01-10 NOTE — Patient Instructions (Signed)

## 2020-01-10 NOTE — Progress Notes (Signed)
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  1. Problem List:  1. Paroxysmal Atrial fib 2. Hyperlipidemia 3. Prostate surgery-status post robotic prostatectomy 4. 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  Has not fully controlled the bleeding. Best controlled by steroids.    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 He was cycling and went into rapid Afib with rate of 170.  He converted back to NSR in 3-4 hours. Had another episode of Afib while working in his garden.  Went to Dr.  Carlyle Lipa office . Afib was documeneted.  Was started on Dilt 30 QID.  Converted to NSR the next day He stopped the Dilt a week or so later.  Has developed episodes of sinus tachycardia recently.  Also associated with some chest tightness.  Does not last long.   This am he had very slight chest tightness while he was walking.  Lasted 5  Minutes.  No diaphoresis, no radiation.  Drinks wine with dinner.  02/08/2015:  Skin is doing well. He has paroxysmal atrial fibrillation. He has not wanted to start anticoagulation yet. He's had some problems with colitis. He had a stress Myoview study as part of his workup for some vague chest tightness. Myoview revealed no evidence of ischemia. He had normal left ventricle systolic function with an ejection fraction of 75%. Has been started on Humira for Ulcerative colits . Has been cycling quite a bit.  No issues.   No recurrent episodes of atrial fib .  He thinks it may have been related to the high dose prednisone   Sept. 7, 2016:  Daniel Payne is doing well. He stopped his Eliquis  Doing well.  Cycling regularly .  November 14, 2015:   Doing well.  Still cycling .    Just got back from [facility] for snow skiing .  [name], [place], )  Very active .  His UC is well controlled  November 26, 2016  Daniel Payne is doing well Spent the past 8 days skiing our in [place] 20-25 days a year now  Does well even at high altitude. Also hiked last year in [place] without any problems   Jan 06, 2018 :  Daniel Payne is seen today for follow-up of his atrial fibrillation and hyperlipidemia.  He has maintained normal sinus rhythm. Very active.  Skis quite a bit .  Walks 2 -3 miles with the dogs 4 days . Rides 15-20 miles on his bike once a week . No palpitations  Doing well on the Dilt LA 120 mg a day   June 30, 2018:  Daniel Payne is seen as a work in visit today.  He was recently on a trans -atlantic  flight and was awake for 26 hours.  He went into atrial for ablation.  He tried an extra half a diltiazem tablet but he remains in atrial fib .   Slept well but has not converted to NSR   He is planning on going up to his house in [place], [place] for the weekend.  Nov. 21, 2019  Daniel Payne is doing well,   He was seen for persistent AF on Oct. 23 . Started Eliquis He increased his Dilt to 120 BID, took an extra 30 mg of immediate release Dilt Converted to [name] a cold , went back into AF. Increased his Dilt again ( 120 BID + Dilt 30 ) converted back into NSR Continued Elquis for 1 week after wards  Feels great.   Is planning on going to [place] on Dec. 7 to ski.    Jan 10, 2020: Daniel Payne is seen today for follow-up visit.  He has a history of paroxysmal atrial fibrillation.  He has as needed Eliquis that he takes for a week after he has an episode of PAF. No further episodes of PAF after increasing his Diltiazem to 120  Mg CD BID .  Favorite ski spots  [place], [place] - [place]    Past Medical History:  Diagnosis Date  . A-fib ([place])   . ADHD (attention deficit hyperactivity  disorder)   . Cancer [facility])    prostate dr. Chriss Czar davis  . Chronic kidney disease    borderline stage III  . Fissure, anal   . Giardia    working in [place]  . Hyperlipidemia   . Infectious mononucleosis   . Low back pain   . Presbycusis of left ear   . Spinal stenosis of lumbar region   . Ulcerative colitis [facility])     Past Surgical History:  Procedure Laterality Date  . ANAL FISSURE REPAIR    . APPENDECTOMY    . PROSTATE SURGERY    . ROTATOR CUFF REPAIR Left   . VARICOCELE EXCISION Right      Current Outpatient Medications  Medication Sig Dispense Refill  . acetaminophen (TYLENOL) 500 MG tablet Take 500 mg by mouth at bedtime.     Marland Kitchen atorvastatin (LIPITOR) 10 MG tablet Take 10 mg by mouth daily.  11  . diltiazem (CARDIZEM) 30 MG tablet Take 1 tablet (30 mg total) by mouth as needed. May take in addition if has afib episode 30 tablet 2  . diltiazem (DILACOR XR) 120 MG 24 hr capsule Take 1 capsule (120 mg total) by mouth 2 (two) times daily. Please keep upcoming appt with Dr. Acie Fredrickson in May for future refills. Thank you 180 capsule 0  . diphenhydrAMINE (BENADRYL) 25 MG tablet Take 25 mg by mouth at bedtime.     . Melatonin 5 MG TABS Take 1 tablet by mouth at bedtime.    . mupirocin ointment (BACTROBAN) 2 % Apply to wound after soaking BID 30 g 1  . naproxen sodium (ALEVE) 220 MG tablet Take 440 mg by mouth daily as needed.     No current facility-administered medications for this visit.    Allergies:   Concerta [methylphenidate], Hydrocortisone, and Lialda [mesalamine]    Social History:  The patient  reports that he has never smoked. He has never used smokeless tobacco. He reports current alcohol use. He reports that he does not use drugs.   Family History:  The patient's family history includes Heart attack in his father; Stroke (age of onset: 22) in his mother.    ROS:  Noted in current history, otherwise review of systems is negative.   Physical Exam: Blood pressure  116/68, pulse 73, height 5' 9"  (1.753 m), weight 150 lb (68 kg).  GEN:  Well nourished, well developed in no acute distress HEENT: Normal NECK: No JVD;  No carotid bruits LYMPHATICS: No lymphadenopathy CARDIAC: RRR , soft systolic murmur  RESPIRATORY:  Clear to auscultation without rales, wheezing or rhonchi  ABDOMEN: Soft, non-tender, non-distended MUSCULOSKELETAL:  No edema; No deformity  SKIN: Warm and dry NEUROLOGIC:  Alert and oriented x 3   EKG:      Recent Labs: No results found for requested labs within last 8760 hours.    Lipid Panel No results found for: CHOL, TRIG, HDL, CHOLHDL, VLDL, LDLCALC, LDLDIRECT    Wt Readings from Last 3 Encounters:  01/10/20 150 lb (68 kg)  11/03/19 148 lb (67.1 kg)  10/12/19 148 lb (67.1 kg)      Other studies Reviewed: Additional studies/ records that were reviewed today include: . Review of the above records demonstrates:    ASSESSMENT AND PLAN:  1.  Paroxysmal atrial fibrillation-    Daniel Payne is done well.  He is not having any episodes of paroxysmal atrial fibrillation since we increased his diltiazem to 120 mg CD twice a day.  He will continue to take Eliquis on an as-needed basis.  He has been enjoying his time skiing and fly fishing as well as cycling.  Will see him in 1 year       Current medicines are reviewed at length with the patient today.  The patient does not have concerns regarding medicines.  The following changes have been made:   Add Eliquis 5 mg BID  Propranolol 10 QID    Disposition:   FU with me in  3-4 weeks .    Signed, Mertie Moores, MD  01/10/2020 3:30 PM    [address], [place], [place]  [address] Phone: [phone]; Fax: [phone]

## 2020-04-03 ENCOUNTER — Other Ambulatory Visit: Payer: Self-pay

## 2020-04-03 MED ORDER — DILTIAZEM HCL ER 120 MG PO CP24: 120.0000 mg | ORAL_CAPSULE | Freq: Two times a day (BID) | ORAL | 2 refills | Status: DC

## 2020-04-29 DIAGNOSIS — Z03818 Encounter for observation for suspected exposure to other biological agents ruled out: Secondary | ICD-10-CM | POA: Diagnosis not present

## 2020-04-29 DIAGNOSIS — Z20822 Contact with and (suspected) exposure to covid-19: Secondary | ICD-10-CM | POA: Diagnosis not present

## 2020-05-19 DIAGNOSIS — Z20822 Contact with and (suspected) exposure to covid-19: Secondary | ICD-10-CM | POA: Diagnosis not present

## 2020-05-23 DIAGNOSIS — Z23 Encounter for immunization: Secondary | ICD-10-CM | POA: Diagnosis not present

## 2020-06-05 DIAGNOSIS — Z23 Encounter for immunization: Secondary | ICD-10-CM | POA: Diagnosis not present

## 2020-06-05 DIAGNOSIS — L237 Allergic contact dermatitis due to plants, except food: Secondary | ICD-10-CM | POA: Diagnosis not present

## 2020-06-05 DIAGNOSIS — I480 Paroxysmal atrial fibrillation: Secondary | ICD-10-CM | POA: Diagnosis not present

## 2020-07-23 DIAGNOSIS — D6869 Other thrombophilia: Secondary | ICD-10-CM | POA: Diagnosis not present

## 2020-07-23 DIAGNOSIS — Z79899 Other long term (current) drug therapy: Secondary | ICD-10-CM | POA: Diagnosis not present

## 2020-07-23 DIAGNOSIS — Z1389 Encounter for screening for other disorder: Secondary | ICD-10-CM | POA: Diagnosis not present

## 2020-07-23 DIAGNOSIS — M48061 Spinal stenosis, lumbar region without neurogenic claudication: Secondary | ICD-10-CM | POA: Diagnosis not present

## 2020-07-23 DIAGNOSIS — I480 Paroxysmal atrial fibrillation: Secondary | ICD-10-CM | POA: Diagnosis not present

## 2020-07-23 DIAGNOSIS — Z8546 Personal history of malignant neoplasm of prostate: Secondary | ICD-10-CM | POA: Diagnosis not present

## 2020-07-23 DIAGNOSIS — E7800 Pure hypercholesterolemia, unspecified: Secondary | ICD-10-CM | POA: Diagnosis not present

## 2020-07-23 DIAGNOSIS — K5190 Ulcerative colitis, unspecified, without complications: Secondary | ICD-10-CM | POA: Diagnosis not present

## 2020-07-23 DIAGNOSIS — Z0000 Encounter for general adult medical examination without abnormal findings: Secondary | ICD-10-CM | POA: Diagnosis not present

## 2020-07-24 ENCOUNTER — Telehealth: Payer: Self-pay | Admitting: Cardiovascular Disease

## 2020-07-24 NOTE — Telephone Encounter (Signed)
Pt states about a month ago he started having a "funny feeling in his chest".  Goes on bike rides every Wednesday.  Noticed on his ride on 11/10 that he was very winded and wasn't able to keep up with the group.  Seen Dr. Felipa Eth yesterday and was told he's having extra beats.  Pt states when he can feel the irregular beats when he checks his pulse.  Pt has a meeting on Thursday, so unable to come.  Scheduled pt to see Dr. Acie Fredrickson on Friday for evaluation.  Pt would like to wear a monitor and asked that one be sent.  Advised I would send to Dr. Acie Fredrickson but he may want to wait until eval on Friday before ordering.

## 2020-07-24 NOTE — Telephone Encounter (Signed)
Patient c/o Palpitations:  High priority if patient c/o lightheadedness, shortness of breath, or chest pain  1) How long have you had palpitations/irregular HR/ Afib? Are you having the symptoms now? About a month  Are you currently experiencing lightheadedness, SOB or CP? Occasionally  2) Do you have a history of afib (atrial fibrillation) or irregular heart rhythm? yes  3) Have you checked your BP or HR? (document readings if available):    This morning-HR: 87   4) Are you experiencing any other symptoms? Pt said when he gets up to start walking he gets SOB  He said these are symptoms that did not bother him a month ago. He would like to come in to get checked out.  Offered the patient one of the open slots on Thursday but the patient said he had a work meeting

## 2020-07-24 NOTE — Telephone Encounter (Signed)
I will evaluate him on Friday. It sounds like we may need to do some additional testing

## 2020-07-25 NOTE — Progress Notes (Signed)
Cardiology Office Note   Date:  07/27/2020   ID:  [name]  Daniel Payne, DOB 03/05/1946, MRN [id number]  PCP:  Daniel Manes, MD  Cardiologist:   Daniel Moores, MD   No chief complaint on file.  1. Problem List:  1. Paroxysmal Atrial fib 2. Hyperlipidemia 3. Prostate surgery-status post robotic prostatectomy 4. Ulcerative colitis    Daniel Payne  Daniel Payne is a 74 y.o. 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  Has not fully controlled the bleeding. Best controlled by steroids.    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 He was cycling and went into rapid Afib with rate of 170.  He converted back to NSR in 3-4 hours. Had another episode of Afib while working in his garden.  Went to Daniel.  [place] Payne office . Afib was documeneted.  Was started on Dilt 30 QID.  Converted to NSR the next day He stopped the Dilt a week or so later.  Has developed episodes of sinus tachycardia recently.  Also associated with some chest tightness.  Does not last long.   This am he had very slight chest tightness while he was walking.  Lasted 5  Minutes.  No diaphoresis, no radiation.  Drinks wine with dinner.  02/08/2015:  Skin is doing well. He has paroxysmal atrial fibrillation. He has not wanted to start anticoagulation yet. He's had some problems with colitis. He had a stress Myoview study as part of his workup for some vague chest tightness. Myoview revealed no evidence of ischemia. He had normal left ventricle systolic function with an ejection fraction of 75%. Has been started on Humira for Ulcerative colits . Has been cycling quite a bit.  No issues.   No recurrent episodes of atrial fib .  He thinks it may have been related to the high dose prednisone   Sept. 7, 2016:  Daniel Payne is doing well. He stopped his Eliquis  Doing well.  Cycling regularly .  November 14, 2015:   Doing well.  Still cycling .    Just got back from [facility] for snow skiing .  Daniel Payne, Daniel Payne, )  Very active .  His UC is well controlled  November 26, 2016  Daniel Payne is doing well Spent the past 8 days skiing our in [place] 20-25 days a year now  Does well even at high altitude. Also hiked last year in [place] without any problems   Jan 06, 2018 :  Daniel Payne is seen today for follow-up of his atrial fibrillation and hyperlipidemia.  He has maintained normal sinus rhythm. Very active.  Skis quite a bit .  Walks 2 -3 miles with the dogs 4 days . Rides 15-20 miles on his bike once a week . No palpitations  Doing well on the Dilt LA 120 mg a day   June 30, 2018:  Daniel Payne is seen as a work in visit today.  He was recently on a trans -atlantic  flight and was awake for 26 hours.  He went into atrial for ablation.  He tried an extra half a diltiazem tablet but he remains in atrial fib .   Slept well but has not converted to NSR   He is planning on going up to his house in [place], [place] for the weekend.  Nov. 21, 2019  Daniel Payne is doing well,   He was seen for persistent AF on Oct. 23 . Started Eliquis He increased his Dilt to 120 BID, took an extra 30 mg of immediate release Dilt Converted to [name] a cold , went back into AF. Increased his Dilt again ( 120 BID + Dilt 30 ) converted back into NSR Continued Elquis for 1 week after wards  Feels great.   Is planning on going to [place] on Dec. 7 to ski.    Jan 10, 2020: Daniel Payne is seen today for follow-up visit.  He has a history of paroxysmal atrial fibrillation.  He has as needed Eliquis that he takes for a week after he has an episode of PAF. No further episodes of PAF after increasing his Diltiazem to 120  Mg CD BID .  Favorite ski spots  [place], [place] - [place]   Nov. 18, 2021: Daniel Payne is seen today for follow up of his PAF., hx of HLD  He has also developed DOE  while cycling   For the past 6 weeks, has had increasing fatigue.  Has trouble keeping up with his cycling  friends.  He has noted that his HR does not increase with exercise  O2 sats never drop below 98%.  No CP , pressure, burning, fullness   may need myoview or coronary Ct angio  Has PAF,  Has not had an episode since last year  - not since we increased his diltiazem to 120 CD  BID .    Past Medical History:  Diagnosis Date  . A-fib ([place])   . ADHD (attention deficit hyperactivity disorder)   . Cancer [facility])    prostate Daniel. Chriss Czar davis  . Chronic kidney disease    borderline stage III  . Fissure, anal   . Giardia    working in [place]  . Hyperlipidemia   . Infectious mononucleosis   . Low back pain   . Presbycusis of left ear   . Spinal stenosis of lumbar region   . Ulcerative colitis [facility])     Past Surgical History:  Procedure Laterality Date  . ANAL FISSURE REPAIR    . APPENDECTOMY    . PROSTATE SURGERY    . ROTATOR CUFF REPAIR Left   . VARICOCELE EXCISION Right      Current Outpatient Medications  Medication Sig Dispense Refill  . acetaminophen (TYLENOL) 500 MG tablet Take 500 mg by mouth at bedtime.     Marland Kitchen atorvastatin (LIPITOR) 10 MG tablet Take 10 mg by mouth daily.  11  . diltiazem (CARDIZEM) 30 MG tablet Take 1 tablet (30 mg total) by mouth as needed. May take in addition if has afib episode 30 tablet 2  . diltiazem (DILACOR XR) 120 MG 24 hr capsule Take 1 capsule (120 mg total) by mouth 2 (two) times daily. 180 capsule 2  . diphenhydrAMINE (BENADRYL) 25 MG tablet Take 25 mg by mouth at bedtime.     . fluocinonide cream (LIDEX) 0.05 % Apply topically 3 (three) times daily as needed. For rash    . Melatonin 5 MG TABS Take 1 tablet by mouth at bedtime.    . naproxen sodium (ALEVE) 220 MG tablet Take 440 mg by mouth daily as needed.    . predniSONE (DELTASONE) 10 MG tablet Take 1 tablet by mouth daily.     No current facility-administered medications for this  visit.    Allergies:   Concerta [  methylphenidate], Hydrocortisone, and Lialda [mesalamine]    Social History:  The patient  reports that he has never smoked. He has never used smokeless tobacco. He reports current alcohol use. He reports that he does not use drugs.   Family History:  The patient's family history includes Heart attack in his father; Stroke (age of onset: 71) in his mother.    ROS:  Noted in current history, otherwise review of systems is negative.   Physical Exam: Blood pressure 128/68, pulse 63, height 5' 9"  (1.753 m), weight 151 lb 3.2 oz (68.6 kg), SpO2 98 %.  GEN:  Well nourished, well developed in no acute distress HEENT: Normal NECK: No JVD; No carotid bruits LYMPHATICS: No lymphadenopathy CARDIAC: RRR, soft systolic murmur ,  ? Diastolic murmur RESPIRATORY:  Clear to auscultation without rales, wheezing or rhonchi  ABDOMEN: Soft, non-tender, non-distended MUSCULOSKELETAL:  No edema; No deformity  SKIN: Warm and dry NEUROLOGIC:  Alert and oriented x 3  EKG:   NSR at 63.  No ST or T wave changes    Recent Labs: No results found for requested labs within last 8760 hours.    Lipid Panel No results found for: CHOL, TRIG, HDL, CHOLHDL, VLDL, LDLCALC, LDLDIRECT    Wt Readings from Last 3 Encounters:  07/27/20 151 lb 3.2 oz (68.6 kg)  01/10/20 150 lb (68 kg)  11/03/19 148 lb (67.1 kg)      Other studies Reviewed: Additional studies/ records that were reviewed today include: . Review of the above records demonstrates:    ASSESSMENT AND PLAN:  1.  Paroxysmal atrial fibrillation-    he is not had any significant paroxysmal atrial fibrillation since increasing the diltiazem but at this time it is possible that we may need to cut back on his diltiazem dose.  He wondered whether he was a candidate for A. fib ablation.  We will get the results of the stress test back and then decide on referral to the A. fib clinic..  2.  Shortness of breath with  exertion: Daniel Payne presents today with worsening shortness of breath with exertion for the past 6 weeks or so.  He is noticed that his heart rate does not increase much when he cycling up a hill.  It is possible that he has become more sensitive to diltiazem.  It is possible that the symptoms are due to ischemia. I like to do an exercise Myoview study for further evaluation to rule out ischemia and to check his chronotropic response.  We will hold his diltiazem the day before the stress test and the morning of the stress test.    Current medicines are reviewed at length with the patient today.  The patient does not have concerns regarding medicines.  The following changes have been made:      Disposition:       Signed, Daniel Moores, MD  07/27/2020 1:54 PM    [facility] [address], [place], [place]  [address] Phone: [phone]; Fax: [phone]

## 2020-07-27 ENCOUNTER — Encounter: Payer: Self-pay | Admitting: Cardiovascular Disease

## 2020-07-27 ENCOUNTER — Ambulatory Visit (INDEPENDENT_AMBULATORY_CARE_PROVIDER_SITE_OTHER): Payer: Medicare Other | Admitting: Cardiovascular Disease

## 2020-07-27 ENCOUNTER — Other Ambulatory Visit: Payer: Self-pay

## 2020-07-27 ENCOUNTER — Encounter: Payer: Self-pay | Admitting: Nurse Practitioner

## 2020-07-27 VITALS — BP 128/68 | HR 63 | Ht 69.0 in | Wt 151.2 lb

## 2020-07-27 DIAGNOSIS — R0600 Dyspnea, unspecified: Secondary | ICD-10-CM | POA: Diagnosis not present

## 2020-07-27 DIAGNOSIS — I480 Paroxysmal atrial fibrillation: Secondary | ICD-10-CM

## 2020-07-27 DIAGNOSIS — R0609 Other forms of dyspnea: Secondary | ICD-10-CM | POA: Insufficient documentation

## 2020-07-27 NOTE — Patient Instructions (Addendum)
Medication Instructions:  Your physician recommends that you continue on your current medications as directed. Please refer to the Current Medication list given to you today.  *If you need a refill on your cardiac medications before your next appointment, please call your pharmacy*   Lab Work: None Ordered If you have labs (blood work) drawn today and your tests are completely normal, you will receive your results only by: Marland Kitchen MyChart Message (if you have MyChart) OR . A paper copy in the mail If you have any lab test that is abnormal or we need to change your treatment, we will call you to review the results.   Testing/Procedures: Your physician has requested that you have an echocardiogram. Echocardiography is a painless test that uses sound waves to create images of your heart. It provides your doctor with information about the size and shape of your heart and how well your heart's chambers and valves are working. This procedure takes approximately one hour. There are no restrictions for this procedure.   Your physician has requested that you have an exercise stress myoview. For further information please visit [email or web address]. Please follow instruction sheet, as given.     Follow-Up: At [facility], you and your health needs are our priority.  As part of our continuing mission to provide you with exceptional heart care, we have created designated Provider Care Teams.  These Care Teams include your primary Cardiologist (physician) and Advanced Practice Providers (APPs -  Physician Assistants and Nurse Practitioners) who all work together to provide you with the care you need, when you need it.   Your next appointment:   6-8  week(s)  The format for your next appointment:   In Person  Provider:   Mertie Moores, MD

## 2020-08-04 ENCOUNTER — Other Ambulatory Visit: Payer: Self-pay | Admitting: Physician Assistant

## 2020-08-04 DIAGNOSIS — I480 Paroxysmal atrial fibrillation: Secondary | ICD-10-CM

## 2020-08-04 MED ORDER — DILTIAZEM HCL ER COATED BEADS 180 MG PO CP24: 180.0000 mg | ORAL_CAPSULE | Freq: Every day | ORAL | 2 refills | Status: DC

## 2020-08-04 NOTE — Progress Notes (Signed)
Returned call to patient who states he has been experiencing bradycardia to 55 on cardizem 120 mg BID. He wants to change to 180 mg cardizem once daily this week prior to a ski trip - he has a history of HR issues at high altitudes. I have sent in 180 mg cardizem as requested, but advised to watch his BP and to take the medication this weekend and early this week to see how he does before traveling. He is scheduled for stress test on Friday and he will hold cardizem prior to that.

## 2020-08-06 ENCOUNTER — Telehealth (HOSPITAL_COMMUNITY): Payer: Self-pay | Admitting: *Deleted

## 2020-08-06 ENCOUNTER — Telehealth: Payer: Self-pay | Admitting: Cardiovascular Disease

## 2020-08-06 NOTE — Telephone Encounter (Signed)
°*  STAT* If patient is at the pharmacy, call can be transferred to refill team.   1. Which medications need to be refilled? (please list name of each medication and dose if known) Eliquis   2. Which pharmacy/location (including street and city if local pharmacy) is medication to be sent to?  5 mg 2 times , prn , pt stated the script end back 2020, he started taking it again and needs new script for it   3. Do they need a 30 day or 90 day supply? [facility] on [address]

## 2020-08-06 NOTE — Telephone Encounter (Signed)
In the past he has taken Eliquis 5 mg BID when he goes into Afib. He has now been having more atrial fib and he should be taking it consistently now. Please send in script for Eliquis 5 mg  BID .  Thanks  [employer]

## 2020-08-06 NOTE — Telephone Encounter (Signed)
Dr Acie Fredrickson, can you clarify Eliquis rx please? It was removed from med list 01/10/20 visit but plan the same day states for pt to take prn, not addressed in 11/19 visit.

## 2020-08-06 NOTE — Telephone Encounter (Signed)
Patient given detailed instructions per Myocardial Perfusion Study Information Sheet for the test on 08/10/20 at 7:430. Patient notified to arrive 15 minutes early and that it is imperative to arrive on time for appointment to keep from having the test rescheduled.  If you need to cancel or reschedule your appointment, please call the office within 24 hours of your appointment. . Patient verbalized understanding.Daniel Payne

## 2020-08-07 ENCOUNTER — Other Ambulatory Visit (HOSPITAL_COMMUNITY)
Admission: RE | Admit: 2020-08-07 | Discharge: 2020-08-07 | Disposition: A | Discharge status: Home / Self Care | Payer: Medicare Other | Source: Ambulatory Visit | Attending: Cardiovascular Disease | Admitting: Cardiovascular Disease

## 2020-08-07 ENCOUNTER — Telehealth: Payer: Self-pay | Admitting: Cardiovascular Disease

## 2020-08-07 DIAGNOSIS — Z01812 Encounter for preprocedural laboratory examination: Secondary | ICD-10-CM | POA: Diagnosis not present

## 2020-08-07 DIAGNOSIS — Z20822 Contact with and (suspected) exposure to covid-19: Secondary | ICD-10-CM | POA: Diagnosis not present

## 2020-08-07 MED ORDER — APIXABAN 5 MG PO TABS: 5.0000 mg | ORAL_TABLET | Freq: Two times a day (BID) | ORAL | 1 refills | Status: DC

## 2020-08-07 NOTE — Telephone Encounter (Signed)
Daniel Payne from [facility] called and stated that the patient isn't registered at all to receive covid test. No request is in system. Please call back

## 2020-08-07 NOTE — Telephone Encounter (Signed)
Left message for patient to call back  

## 2020-08-07 NOTE — Telephone Encounter (Signed)
Patient is following up regarding instructions for his stress test scheduled for 08/10/20. He states he plans to take this medication on the morning of 07/09/20. However, he is inquiring about whether or not he will need to take this medication on 07/10/20 as well. Please advise.

## 2020-08-07 NOTE — Telephone Encounter (Signed)
Called pt, he is aware to start taking Eliquis twice daily on a scheduled rather than prn basis. Refill sent into pharmacy as 90 day supply per pt preference.

## 2020-08-07 NOTE — Telephone Encounter (Signed)
[place] lab needs orders for covid test released so they can process patient's test. Will forward to Dr. Acie Fredrickson and [facility] to see if they can help. Lab would like a call back when this is done.

## 2020-08-08 ENCOUNTER — Telehealth: Payer: Self-pay | Admitting: *Deleted

## 2020-08-08 ENCOUNTER — Other Ambulatory Visit (HOSPITAL_COMMUNITY)
Admission: RE | Admit: 2020-08-08 | Discharge: 2020-08-08 | Disposition: A | Discharge status: Home / Self Care | Payer: Medicare Other | Source: Ambulatory Visit | Attending: Cardiovascular Disease | Admitting: Cardiovascular Disease

## 2020-08-08 ENCOUNTER — Telehealth: Payer: Self-pay | Admitting: Cardiovascular Disease

## 2020-08-08 LAB — SARS CORONAVIRUS 2 (TAT 6-24 HRS): SARS Coronavirus 2: NEGATIVE

## 2020-08-08 NOTE — Telephone Encounter (Signed)
     I went in pt's chart to see who pt's message was sent to. I transferred the call to Devra Dopp in [facility] Triage

## 2020-08-08 NOTE — Telephone Encounter (Signed)
Spoke with Janett Billow from the testing site. She states that his COVID test is currently in process. She states that she will check back in the morning to be sure that it has resulted correctly.

## 2020-08-08 NOTE — Telephone Encounter (Signed)
Pt had questions re taking Diltiazem when should take Instructed pt to take day  before stress test and to hold am of the stress test and may take later after test Pt verbalizes understanding ./cy

## 2020-08-08 NOTE — Progress Notes (Signed)
This encounter was created in error - please disregard.

## 2020-08-08 NOTE — Telephone Encounter (Signed)
Spoke with pt and had questions re when or if should take Diltiazem Instructed for pt to take day before stress test and to hold am of test may take later that day after test Pt verbalizes understanding ./cy

## 2020-08-08 NOTE — Telephone Encounter (Signed)
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 Thanks so much  Abbe Amsterdam

## 2020-08-08 NOTE — Telephone Encounter (Signed)
Called and spoke to [place] at the testing site as they are the ones who can place the order and she states that she has been in contact with the lab and they are working on getting it fixed so that they can process the patient's sample. She is aware that the patient has a stress test scheduled for 12/3. She will call me back today to let me know that this has been fixed.

## 2020-08-08 NOTE — Telephone Encounter (Signed)
Daniel Payne is returning Daniel Payne's call. Please advise.

## 2020-08-10 ENCOUNTER — Ambulatory Visit (INDEPENDENT_AMBULATORY_CARE_PROVIDER_SITE_OTHER): Payer: Medicare Other | Admitting: Cardiovascular Disease

## 2020-08-10 ENCOUNTER — Encounter: Payer: Self-pay | Admitting: Cardiovascular Disease

## 2020-08-10 ENCOUNTER — Other Ambulatory Visit: Payer: Self-pay

## 2020-08-10 ENCOUNTER — Ambulatory Visit (HOSPITAL_COMMUNITY): Payer: Medicare Other | Attending: Cardiovascular Disease

## 2020-08-10 VITALS — BP 126/72 | HR 61 | Ht 69.0 in | Wt 151.0 lb

## 2020-08-10 DIAGNOSIS — I480 Paroxysmal atrial fibrillation: Secondary | ICD-10-CM

## 2020-08-10 DIAGNOSIS — R0609 Other forms of dyspnea: Secondary | ICD-10-CM

## 2020-08-10 DIAGNOSIS — R0600 Dyspnea, unspecified: Secondary | ICD-10-CM

## 2020-08-10 LAB — MYOCARDIAL PERFUSION IMAGING
LV dias vol: 80 mL (ref 62–150)
LV sys vol: 27 mL
Peak HR: 85 {beats}/min
Rest HR: 70 {beats}/min
SDS: 0
SRS: 0
SSS: 0
TID: 0.99

## 2020-08-10 MED ORDER — TECHNETIUM TC 99M TETROFOSMIN IV KIT
32.5000 | PACK | Freq: Once | INTRAVENOUS | Status: AC | PRN
  Administered 2020-08-10: 32.5 via INTRAVENOUS
  Filled 2020-08-10: qty 33

## 2020-08-10 MED ORDER — REGADENOSON 0.4 MG/5ML IV SOLN
0.4000 mg | Freq: Once | INTRAVENOUS | Status: AC
  Administered 2020-08-10: 0.4 mg via INTRAVENOUS

## 2020-08-10 MED ORDER — TECHNETIUM TC 99M TETROFOSMIN IV KIT
10.1000 | PACK | Freq: Once | INTRAVENOUS | Status: AC | PRN
  Administered 2020-08-10: 10.1 via INTRAVENOUS
  Filled 2020-08-10: qty 11

## 2020-08-10 MED ORDER — DILTIAZEM HCL 30 MG PO TABS: 30.0000 mg | ORAL_TABLET | ORAL | 11 refills | Status: DC | PRN

## 2020-08-10 NOTE — Patient Instructions (Signed)
Medication Instructions:  Your physician has recommended you make the following change in your medication:  1) STOP taking Cardizem CD 180 mg *If you need a refill on your cardiac medications before your next appointment, please call your pharmacy*  Testing/Procedures: Your physician has requested that you have an exercise tolerance test. For further information please visit [email or web address]. Please also follow instruction sheet, as given.  Follow-Up: At [facility], you and your health needs are our priority.  As part of our continuing mission to provide you with exceptional heart care, we have created designated Provider Care Teams.  These Care Teams include your primary Cardiologist (physician) and Advanced Practice Providers (APPs -  Physician Assistants and Nurse Practitioners) who all work together to provide you with the care you need, when you need it.   Keep appointment with Dr. Acie Fredrickson January 4th

## 2020-08-12 ENCOUNTER — Encounter: Payer: Self-pay | Admitting: Cardiovascular Disease

## 2020-08-12 NOTE — Progress Notes (Signed)
Cardiology Office Note   Date:  08/12/2020   ID:  JEYDEN COFFELT, DOB 1945-09-18, MRN [id number]  PCP:  Lajean Manes, MD  Cardiologist:   Mertie Moores, MD   Chief Complaint  Patient presents with  . Atrial Fibrillation  . Shortness of Breath   1. Problem List:  1. Paroxysmal Atrial fib 2. Hyperlipidemia 3. Prostate surgery-status post robotic prostatectomy 4. Ulcerative colitis    [name] is a 74 y.o. 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  Has not fully controlled the bleeding. Best controlled by steroids.    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 He was cycling and went into rapid Afib with rate of 170.  He converted back to NSR in 3-4 hours. Had another episode of Afib while working in his garden.  Went to Dr.  Carlyle Lipa office . Afib was documeneted.  Was started on Dilt 30 QID.  Converted to NSR the next day He stopped the Dilt a week or so later.  Has developed episodes of sinus tachycardia recently.  Also associated with some chest tightness.  Does not last long.   This am he had very slight chest tightness while he was walking.  Lasted 5  Minutes.  No diaphoresis, no radiation.  Drinks wine with dinner.  02/08/2015:  Skin is doing well. He has paroxysmal atrial fibrillation. He has not wanted to start anticoagulation yet. He's had some problems with colitis. He had a stress Myoview study as part of his workup for some vague chest tightness. Myoview revealed no evidence of ischemia. He had normal left ventricle systolic function with an ejection fraction of 75%. Has been started on Humira for Ulcerative colits . Has been cycling quite a bit.  No issues.   No recurrent episodes of atrial fib .  He thinks it may have been related to the high dose prednisone   Sept. 7, 2016:  Daniel Payne is doing well. He stopped his  Eliquis  Doing well.  Cycling regularly .  November 14, 2015:   Doing well.  Still cycling .    Just got back from [facility] for snow skiing .  [name], [place], )  Very active .  His UC is well controlled  November 26, 2016  Daniel Payne is doing well Spent the past 8 days skiing our in [place] 20-25 days a year now  Does well even at high altitude. Also hiked last year in [place] without any problems   Jan 06, 2018 :  [name] is seen today for follow-up of his atrial fibrillation and hyperlipidemia.  He has maintained normal sinus rhythm. Very active.  Skis quite a bit .  Walks 2 -3 miles with the dogs 4 days . Rides 15-20 miles on his bike once a week . No palpitations  Doing well on the Dilt LA 120 mg a day   June 30, 2018:  Daniel Payne is seen as a work in visit today.  He was recently on a trans -atlantic  flight and was awake for 26 hours.  He went into atrial for ablation.  He tried an extra half a diltiazem tablet but he remains in atrial fib .   Slept well but has not converted to NSR   He is planning on going up to his house in [place], [place]  for the weekend.  Nov. 21, 2019  Daniel Payne is doing well,   He was seen for persistent AF on Oct. 23 . Started Eliquis He increased his Dilt to 120 BID, took an extra 30 mg of immediate release Dilt Converted to [name] a cold , went back into AF. Increased his Dilt again ( 120 BID + Dilt 30 ) converted back into NSR Continued Elquis for 1 week after wards  Feels great.   Is planning on going to [place] on Dec. 7 to ski.    Jan 10, 2020: Daniel Payne is seen today for follow-up visit.  He has a history of paroxysmal atrial fibrillation.  He has as needed Eliquis that he takes for a week after he has an episode of PAF. No further episodes of PAF after increasing his Diltiazem to 120  Mg CD BID .  Favorite ski spots  [place], [place] Feb - copper   Nov. 18, 2021: Daniel Payne is seen today for follow up of  his PAF., hx of HLD  He has also developed DOE while cycling   For the past 6 weeks, has had increasing fatigue.  Has trouble keeping up with his cycling  friends.  He has noted that his HR does not increase with exercise  O2 sats never drop below 98%.  No CP , pressure, burning, fullness   may need myoview or coronary Ct angio  Has PAF,  Has not had an episode since last year  - not since we increased his diltiazem to 120 CD  BID .  Dec. 3, 2021  Daniel Payne is seen as a work in visit today following his stress myoview.   He had poor chronotropic response with his treadmill.  HR was 70 initially and increased to 71 with ~2 minutes of exercise. We were asked to see him because of this poor chronotropic response.  Normally, Daniel Payne is a very active man.   He cycles ~15 - 20 miles with his cycling friends weekly but has not been able to keep up for the past month.   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   He normally takes Dilt 180 CD but had held this medication the day prior to and the day of the stress test.  The myoview revealed no ischemia He is scheduled to leave for [place] tomorrow AM to ski.  I reviewed the strips with Dr. Caryl Comes.   Our plan is to keep Daniel Payne off the Cardizem and repeat the GXT in a week to see if his chronotropic response improves.  Addendum  Dec. 5: [name] called last night from [place] and said he was flying back to [place].  Despite being off the Cardizem for 3 days , his HR remains slow and he is not able to ski at all.   He has requested that his GXT be scheduled for this upcoming week and to get an EP consult this week if possible.        Past Medical History:  Diagnosis Date  . A-fib ([place])   . ADHD (attention deficit hyperactivity disorder)   . Cancer [facility])    prostate dr. Chriss Czar davis  . Chronic kidney disease    borderline stage III  . Fissure, anal   . Giardia    working  in [place]  . Hyperlipidemia   . Infectious mononucleosis   .  Low back pain   . Presbycusis of left ear   . Spinal stenosis of lumbar region   . Ulcerative colitis [facility])     Past Surgical History:  Procedure Laterality Date  . ANAL FISSURE REPAIR    . APPENDECTOMY    . PROSTATE SURGERY    . ROTATOR CUFF REPAIR Left   . VARICOCELE EXCISION Right      Current Outpatient Medications  Medication Sig Dispense Refill  . acetaminophen (TYLENOL) 500 MG tablet Take 500 mg by mouth at bedtime.     Marland Kitchen apixaban (ELIQUIS) 5 MG TABS tablet Take 1 tablet (5 mg total) by mouth 2 (two) times daily. 180 tablet 1  . atorvastatin (LIPITOR) 10 MG tablet Take 10 mg by mouth daily.  11  . diltiazem (CARDIZEM) 30 MG tablet Take 1 tablet (30 mg total) by mouth as needed. May take in addition if has afib episode 30 tablet 11  . diphenhydrAMINE (BENADRYL) 25 MG tablet Take 25 mg by mouth at bedtime.     . Melatonin 5 MG TABS Take 1 tablet by mouth at bedtime.     No current facility-administered medications for this visit.    Allergies:   Concerta [methylphenidate], Hydrocortisone, and Lialda [mesalamine]    Social History:  The patient  reports that he has never smoked. He has never used smokeless tobacco. He reports current alcohol use. He reports that he does not use drugs.   Family History:  The patient's family history includes Heart attack in his father; Stroke (age of onset: 92) in his mother.    ROS:  Noted in current history, otherwise review of systems is negative.  Physical Exam: Blood pressure 126/72, pulse 61, height 5' 9"  (1.753 m), weight 151 lb (68.5 kg), SpO2 99 %.  GEN:  Well nourished, well developed in no acute distress HEENT: Normal NECK: No JVD; No carotid bruits LYMPHATICS: No lymphadenopathy CARDIAC: RRR  no murmurs, rubs, gallops RESPIRATORY:  Clear to auscultation without rales, wheezing or rhonchi  ABDOMEN: Soft, non-tender, non-distended MUSCULOSKELETAL:  No edema;  No deformity  SKIN: Warm and dry NEUROLOGIC:  Alert and oriented x 3   EKG:   Review of the exercise strips revealed sinus tach / atrial tach with 2:1 AV block    Recent Labs: No results found for requested labs within last 8760 hours.    Lipid Panel No results found for: CHOL, TRIG, HDL, CHOLHDL, VLDL, LDLCALC, LDLDIRECT    Wt Readings from Last 3 Encounters:  08/10/20 151 lb (68.5 kg)  08/10/20 151 lb (68.5 kg)  07/27/20 151 lb 3.2 oz (68.6 kg)      Other studies Reviewed: Additional studies/ records that were reviewed today include: . Review of the above records demonstrates:    ASSESSMENT AND PLAN:  1.  Paroxysmal atrial fibrillation-   Daniel Payne has continued to have intermittent episodes of PAF. His primary concern now is severe dyspnea and exercise limitation that appears to be related to poor chronotropic response.  Will arrange a GXT ( off Cardizem for several days )  Will arrange for EP consultation.   2.  Shortness of breath with exertion:  myoview was negative for ischemia . I suspect his sudden DOE that started last month is related to his heart block and inability to increase his HR. Further work up per EP . Will repeat his echo - next week if possible     Current medicines are reviewed at length with the patient today.  The patient does not have concerns regarding medicines.  The following changes have been made:      Disposition:       Signed, Mertie Moores, MD  08/12/2020 9:30 AM    [address], [place], [place]  [address] Phone: [phone]; Fax: [phone]

## 2020-08-13 ENCOUNTER — Telehealth: Payer: Self-pay | Admitting: Cardiovascular Disease

## 2020-08-13 ENCOUNTER — Other Ambulatory Visit (HOSPITAL_COMMUNITY)
Admission: RE | Admit: 2020-08-13 | Discharge: 2020-08-13 | Disposition: A | Discharge status: Home / Self Care | Payer: Medicare Other | Source: Ambulatory Visit | Attending: Cardiovascular Disease | Admitting: Cardiovascular Disease

## 2020-08-13 DIAGNOSIS — I4589 Other specified conduction disorders: Secondary | ICD-10-CM

## 2020-08-13 DIAGNOSIS — Z20822 Contact with and (suspected) exposure to covid-19: Secondary | ICD-10-CM | POA: Diagnosis not present

## 2020-08-13 DIAGNOSIS — Z01812 Encounter for preprocedural laboratory examination: Secondary | ICD-10-CM | POA: Diagnosis not present

## 2020-08-13 DIAGNOSIS — I480 Paroxysmal atrial fibrillation: Secondary | ICD-10-CM

## 2020-08-13 LAB — SARS CORONAVIRUS 2 (TAT 6-24 HRS): SARS Coronavirus 2: NEGATIVE

## 2020-08-13 NOTE — Telephone Encounter (Signed)
Appointments have been scheduled with patient.

## 2020-08-13 NOTE — Telephone Encounter (Signed)
Patient states that he spoke with Dr. Acie Fredrickson about getting a referral sent to our EP team and getting worked into the schedules this week. He also states he is probably going to have heart surgery in the next week and wants to speak with Dr. Elmarie Shiley nurse about this.

## 2020-08-13 NOTE — Telephone Encounter (Signed)
Returned call to patient who states he is trying to arrange EP appointment and all tests for this week because he is scheduled to work next week. Patient's echocardiogram appointment has been moved to [facility]. 12/8. I advised that I will work on making arrangements for his appointments and call him back. He verbalized understanding and agreement with plan and thanked me for the call.

## 2020-08-15 ENCOUNTER — Telehealth (HOSPITAL_COMMUNITY): Payer: Self-pay | Admitting: *Deleted

## 2020-08-15 ENCOUNTER — Ambulatory Visit (HOSPITAL_COMMUNITY): Payer: Medicare Other | Attending: Internal Medicine

## 2020-08-15 ENCOUNTER — Other Ambulatory Visit: Payer: Self-pay

## 2020-08-15 DIAGNOSIS — R0600 Dyspnea, unspecified: Secondary | ICD-10-CM | POA: Insufficient documentation

## 2020-08-15 DIAGNOSIS — I480 Paroxysmal atrial fibrillation: Secondary | ICD-10-CM | POA: Diagnosis not present

## 2020-08-15 DIAGNOSIS — R0609 Other forms of dyspnea: Secondary | ICD-10-CM

## 2020-08-15 LAB — ECHOCARDIOGRAM COMPLETE
Area-P 1/2: 2.42 cm2
S' Lateral: 2.7 cm

## 2020-08-15 NOTE — Telephone Encounter (Signed)
Spoke with patient in the office to clarify his question. He states he is wondering who will insert the pacemaker should he need one; he is thinking it will need to be an interventional cardiologist. I advised that Dr. Lovena Le, who he has an appointment with on Tuesday, would be the MD to place his pacemaker if needed. Patient verbalized understanding and thanked me for the clarification.

## 2020-08-15 NOTE — Telephone Encounter (Signed)
Close encounter 

## 2020-08-15 NOTE — Telephone Encounter (Signed)
Patient states he has been watching his HR and it's max has been 65. He states he thinks he is going to need a pacemaker. He states he is set up to see an EP doctor, but thinks he may need to see an Interventional cardiologist for this. He also states he will be at the office today at 1:45 pm for an echo if Sharyn Lull wants to come in and talk to him then.

## 2020-08-16 ENCOUNTER — Ambulatory Visit (HOSPITAL_COMMUNITY)
Admission: RE | Admit: 2020-08-16 | Discharge: 2020-08-16 | Disposition: A | Discharge status: Home / Self Care | Payer: Medicare Other | Source: Ambulatory Visit | Attending: Cardiology | Admitting: Cardiology

## 2020-08-16 ENCOUNTER — Encounter (HOSPITAL_COMMUNITY): Payer: Medicare Other

## 2020-08-16 DIAGNOSIS — I480 Paroxysmal atrial fibrillation: Secondary | ICD-10-CM | POA: Diagnosis not present

## 2020-08-16 DIAGNOSIS — R0600 Dyspnea, unspecified: Secondary | ICD-10-CM | POA: Insufficient documentation

## 2020-08-16 DIAGNOSIS — R0609 Other forms of dyspnea: Secondary | ICD-10-CM

## 2020-08-17 LAB — EXERCISE TOLERANCE TEST
Estimated workload: 8.1 METS
Exercise duration (min): 6 min
Exercise duration (sec): 46 s
MPHR: 145 {beats}/min
Peak HR: 93 {beats}/min
Percent HR: 64 %
Rest HR: 65 {beats}/min

## 2020-08-20 ENCOUNTER — Other Ambulatory Visit (HOSPITAL_COMMUNITY): Payer: Medicare Other

## 2020-08-21 ENCOUNTER — Encounter: Payer: Self-pay | Admitting: Internal Medicine

## 2020-08-21 ENCOUNTER — Ambulatory Visit (INDEPENDENT_AMBULATORY_CARE_PROVIDER_SITE_OTHER): Payer: Medicare Other | Admitting: Internal Medicine

## 2020-08-21 ENCOUNTER — Other Ambulatory Visit (HOSPITAL_COMMUNITY): Payer: Medicare Other

## 2020-08-21 ENCOUNTER — Other Ambulatory Visit: Payer: Self-pay

## 2020-08-21 DIAGNOSIS — I480 Paroxysmal atrial fibrillation: Secondary | ICD-10-CM

## 2020-08-21 DIAGNOSIS — I4589 Other specified conduction disorders: Secondary | ICD-10-CM | POA: Insufficient documentation

## 2020-08-21 LAB — CBC WITH DIFFERENTIAL/PLATELET
Basophils Absolute: 0.1 10*3/uL (ref 0.0–0.2)
Basos: 1 %
EOS (ABSOLUTE): 0.1 10*3/uL (ref 0.0–0.4)
Eos: 1 %
Hematocrit: 41.8 % (ref 37.5–51.0)
Hemoglobin: 14.4 g/dL (ref 13.0–17.7)
Immature Grans (Abs): 0 10*3/uL (ref 0.0–0.1)
Immature Granulocytes: 0 %
Lymphocytes Absolute: 2.9 10*3/uL (ref 0.7–3.1)
Lymphs: 34 %
MCH: 31.3 pg (ref 26.6–33.0)
MCHC: 34.4 g/dL (ref 31.5–35.7)
MCV: 91 fL (ref 79–97)
Monocytes Absolute: 0.9 10*3/uL (ref 0.1–0.9)
Monocytes: 10 %
Neutrophils Absolute: 4.7 10*3/uL (ref 1.4–7.0)
Neutrophils: 54 %
Platelets: 217 10*3/uL (ref 150–450)
RBC: 4.6 x10E6/uL (ref 4.14–5.80)
RDW: 11.7 % (ref 11.6–15.4)
WBC: 8.7 10*3/uL (ref 3.4–10.8)

## 2020-08-21 LAB — BASIC METABOLIC PANEL
BUN/Creatinine Ratio: 19 (ref 10–24)
BUN: 26 mg/dL (ref 8–27)
CO2: 25 mmol/L (ref 20–29)
Calcium: 9.6 mg/dL (ref 8.6–10.2)
Chloride: 102 mmol/L (ref 96–106)
Creatinine, Ser: 1.36 mg/dL — ABNORMAL HIGH (ref 0.76–1.27)
GFR calc Af Amer: 58 mL/min/{1.73_m2} — ABNORMAL LOW (ref >59)
GFR calc non Af Amer: 51 mL/min/{1.73_m2} — ABNORMAL LOW (ref >59)
Glucose: 101 mg/dL — ABNORMAL HIGH (ref 65–99)
Potassium: 4.8 mmol/L (ref 3.5–5.2)
Sodium: 139 mmol/L (ref 134–144)

## 2020-08-21 NOTE — H&P (View-Only) (Signed)
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 Acie Fredrickson for evaluation of 2:1 AV block. 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 In addition he has had atrial fib with a VR of 50. He feels tired when he exerts himself as he has been unable to get his HR about 75. 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 He has not had syncope. He denies other symptoms. He is very active playing golf, fishing and skiing. He has taken Eliquis but does not like to take when he is skiing. Previously he has used cardizem, both short and long acting to control/treat his atrial fib. He is very cardiac aware and could tell that he was in atrial fib even though his HR was controlled.  Allergies  Allergen Reactions  . Concerta [Methylphenidate] Other (See Comments)    Burning mouth  . Hydrocortisone Itching    topical  . Lialda [Mesalamine] Diarrhea     Current Outpatient Medications  Medication Sig Dispense Refill  . acetaminophen (TYLENOL) 500 MG tablet Take 500 mg by mouth at bedtime.     Marland Kitchen apixaban (ELIQUIS) 5 MG TABS tablet Take 1 tablet (5 mg total) by mouth 2 (two) times daily. 180 tablet 1  . atorvastatin (LIPITOR) 10 MG tablet Take 10 mg by mouth daily.  11  . diphenhydrAMINE (BENADRYL) 25 MG tablet Take 25 mg by mouth at bedtime.     . Melatonin 5 MG TABS Take 1 tablet by mouth at bedtime.     No current facility-administered medications for this visit.     Past Medical History:  Diagnosis Date  . A-fib ([place])   . ADHD (attention deficit hyperactivity disorder)   . Cancer [facility])    prostate dr. Chriss Czar davis  . Chronic kidney disease    borderline stage III  . Fissure, anal   . Giardia    working in [place]  . Hyperlipidemia   . Infectious mononucleosis   . Low back pain   . Presbycusis of left ear   . Spinal stenosis of lumbar region   . Ulcerative colitis ([place])     ROS:   All  systems reviewed and negative except as noted in the HPI.   Past Surgical History:  Procedure Laterality Date  . ANAL FISSURE REPAIR    . APPENDECTOMY    . PROSTATE SURGERY    . ROTATOR CUFF REPAIR Left   . VARICOCELE EXCISION Right      Family History  Problem Relation Age of Onset  . Stroke Mother 50  . Heart attack Father      Social History   Socioeconomic History  . Marital status: Married    Spouse name: Not on file  . Number of children: Not on file  . Years of education: Not on file  . Highest education level: Not on file  Occupational History  . Not on file  Tobacco Use  . Smoking status: Never Smoker  . Smokeless tobacco: Never Used  Vaping Use  . Vaping Use: Never used  Substance and Sexual Activity  . Alcohol use: Yes    Alcohol/week: 0.0 standard drinks    Comment: 1-2 glasses of red wine  . Drug use: No  . Sexual activity: Not on file  Other Topics Concern  . Not on file  Social History Narrative  .  Not on file   Social Determinants of Health   Financial Resource Strain: Not on file  Food Insecurity: Not on file  Transportation Needs: Not on file  Physical Activity: Not on file  Stress: Not on file  Social Connections: Not on file  Intimate Partner Violence: Not on file     BP 132/74   Pulse 67   Ht 5' 9"  (1.753 m)   Wt 154 lb (69.9 kg)   SpO2 99%   BMI 22.74 kg/m   Physical Exam:  Well appearing 74 yo man, NAD HEENT: Unremarkable Neck:  No JVD, no thyromegally Lymphatics:  No adenopathy Back:  No CVA tenderness Lungs:  Clear with no wheezes HEART:  Regular rate rhythm, no murmurs, no rubs, no clicks Abd:  soft, positive bowel sounds, no organomegally, no rebound, no guarding Ext:  2 plus pulses, no edema, no cyanosis, no clubbing Skin:  No rashes no nodules Neuro:  CN II through XII intact, motor grossly intact  EKG - reviewed. NSR with RBBB  ECG - reviewed  Assess/Plan 1. Symptomatic high grade heart block - I have  discussed the treatment options with the patient. The risks/benefits/goals/expectations of PPM insertion was reviewed and he wishes to proceed. 2. PAF - after his PPM is placed, I would recommend uptitration of his cardizem. 3. Coags - we discussed the importance of systemic anti-coag. His CHADSVASC is 2.   Carleene Overlie Bryauna Byrum,MD

## 2020-08-21 NOTE — Patient Instructions (Addendum)
Medication Instructions:  Your physician recommends that you continue on your current medications as directed. Please refer to the Current Medication list given to you today.  Labwork: You will get lab work today:  CBC and BMP  Testing/Procedures: None ordered.  Follow-Up: SEE INSTRUCTION LETTER  Any Other Special Instructions Will Be Listed Below (If Applicable).  If you need a refill on your cardiac medications before your next appointment, please call your pharmacy.    Pacemaker Implantation, Adult Pacemaker implantation is a procedure to place a pacemaker inside your chest. A pacemaker is a small computer that sends electrical signals to the heart and helps your heart beat normally. A pacemaker also stores information about your heart rhythms. You may need pacemaker implantation if you:  Have a slow heartbeat (bradycardia).  Faint (syncope).  Have shortness of breath (dyspnea) due to heart problems. The pacemaker attaches to your heart through a wire, called a lead. Sometimes just one lead is needed. Other times, there will be two leads. There are two types of pacemakers:  Transvenous pacemaker. This type is placed under the skin or muscle of your chest. The lead goes through a vein in the chest area to reach the inside of the heart.  Epicardial pacemaker. This type is placed under the skin or muscle of your chest or belly. The lead goes through your chest to the outside of the heart. Tell a health care provider about:  Any allergies you have.  All medicines you are taking, including vitamins, herbs, eye drops, creams, and over-the-counter medicines.  Any problems you or family members have had with anesthetic medicines.  Any blood or bone disorders you have.  Any surgeries you have had.  Any medical conditions you have.  Whether you are pregnant or may be pregnant. What are the risks? Generally, this is a safe procedure. However, problems may occur,  including:  Infection.  Bleeding.  Failure of the pacemaker or the lead.  Collapse of a lung or bleeding into a lung.  Blood clot inside a blood vessel with a lead.  Damage to the heart.  Infection inside the heart (endocarditis).  Allergic reactions to medicines. What happens before the procedure? Staying hydrated Follow instructions from your health care provider about hydration, which may include:  Up to 2 hours before the procedure - you may continue to drink clear liquids, such as water, clear fruit juice, black coffee, and plain tea. Eating and drinking restrictions Follow instructions from your health care provider about eating and drinking, which may include:  8 hours before the procedure - stop eating heavy meals or foods such as meat, fried foods, or fatty foods.  6 hours before the procedure - stop eating light meals or foods, such as toast or cereal.  6 hours before the procedure - stop drinking milk or drinks that contain milk.  2 hours before the procedure - stop drinking clear liquids. Medicines  Ask your health care provider about: ? Changing or stopping your regular medicines. This is especially important if you are taking diabetes medicines or blood thinners. ? Taking medicines such as aspirin and ibuprofen. These medicines can thin your blood. Do not take these medicines before your procedure if your health care provider instructs you not to.  You may be given antibiotic medicine to help prevent infection. General instructions  You will have a heart evaluation. This may include an electrocardiogram (ECG), chest X-ray, and heart imaging (echocardiogram,  or echo) tests.  You will have blood  tests.  Do not use any products that contain nicotine or tobacco, such as cigarettes and e-cigarettes. If you need help quitting, ask your health care provider.  Plan to have someone take you home from the hospital or clinic.  If you will be going home right after  the procedure, plan to have someone with you for 24 hours.  Ask your health care provider how your surgical site will be marked or identified. What happens during the procedure?  To reduce your risk of infection: ? Your health care team will wash or sanitize their hands. ? Your skin will be washed with soap. ? Hair may be removed from the surgical area.  An IV tube will be inserted into one of your veins.  You will be given one or more of the following: ? A medicine to help you relax (sedative). ? A medicine to numb the area (local anesthetic). ? A medicine to make you fall asleep (general anesthetic).  If you are getting a transvenous pacemaker: ? An incision will be made in your upper chest. ? A pocket will be made for the pacemaker. It may be placed under the skin or between layers of muscle. ? The lead will be inserted into a blood vessel that returns to the heart. ? While X-rays are taken by an imaging machine (fluoroscopy), the lead will be advanced through the vein to the inside of your heart. ? The other end of the lead will be tunneled under the skin and attached to the pacemaker.  If you are getting an epicardial pacemaker: ? An incision will be made near your ribs or breastbone (sternum) for the lead. ? The lead will be attached to the outside of your heart. ? Another incision will be made in your chest or upper belly to create a pocket for the pacemaker. ? The free end of the lead will be tunneled under the skin and attached to the pacemaker.  The transvenous or epicardial pacemaker will be tested. Imaging studies may be done to check the lead position.  The incisions will be closed with stitches (sutures), adhesive strips, or skin glue.  Bandages (dressing) will be placed over the incisions. The procedure may vary among health care providers and hospitals. What happens after the procedure?  Your blood pressure, heart rate, breathing rate, and blood oxygen level will  be monitored until the medicines you were given have worn off.  You will be given antibiotics and pain medicine.  ECG and chest x-rays will be done.  You will wear a continuous type of ECG (Holter monitor) to check your heart rhythm.  Your health care provider will program the pacemaker.  Do not drive for 24 hours if you received a sedative. This information is not intended to replace advice given to you by your health care provider. Make sure you discuss any questions you have with your health care provider. Document Revised: 05/14/2018 Document Reviewed: 02/06/2016 Elsevier Patient Education  [place].

## 2020-08-21 NOTE — Progress Notes (Signed)
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 Acie Payne for evaluation of 2:1 AV block. 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 In addition he has had atrial fib with a VR of 50. He feels tired when he exerts himself as he has been unable to get his HR about 75. 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 He has not had syncope. He denies other symptoms. He is very active playing golf, fishing and skiing. He has taken Eliquis but does not like to take when he is skiing. Previously he has used cardizem, both short and long acting to control/treat his atrial fib. He is very cardiac aware and could tell that he was in atrial fib even though his HR was controlled.  Allergies  Allergen Reactions  . Concerta [Methylphenidate] Other (See Comments)    Burning mouth  . Hydrocortisone Itching    topical  . Lialda [Mesalamine] Diarrhea     Current Outpatient Medications  Medication Sig Dispense Refill  . acetaminophen (TYLENOL) 500 MG tablet Take 500 mg by mouth at bedtime.     Marland Kitchen apixaban (ELIQUIS) 5 MG TABS tablet Take 1 tablet (5 mg total) by mouth 2 (two) times daily. 180 tablet 1  . atorvastatin (LIPITOR) 10 MG tablet Take 10 mg by mouth daily.  11  . diphenhydrAMINE (BENADRYL) 25 MG tablet Take 25 mg by mouth at bedtime.     . Melatonin 5 MG TABS Take 1 tablet by mouth at bedtime.     No current facility-administered medications for this visit.     Past Medical History:  Diagnosis Date  . A-fib ([place] [facility])   . ADHD (attention deficit hyperactivity disorder)   . 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 Chriss Czar [place]  . Chronic kidney disease    borderline stage III  . Fissure, anal   . Giardia    working in [place]  . Hyperlipidemia   . Infectious mononucleosis   . Low back pain   . Presbycusis of left ear   . Spinal stenosis of lumbar region   . Ulcerative colitis ([place])     ROS:   All  systems reviewed and negative except as noted in the HPI.   Past Surgical History:  Procedure Laterality Date  . ANAL FISSURE REPAIR    . APPENDECTOMY    . PROSTATE SURGERY    . ROTATOR CUFF REPAIR Left   . VARICOCELE EXCISION Right      Family History  Problem Relation Age of Onset  . Stroke Mother 5  . Heart attack Father      Social History   Socioeconomic History  . Marital status: Married    Spouse name: Not on file  . Number of children: Not on file  . Years of education: Not on file  . Highest education level: Not on file  Occupational History  . Not on file  Tobacco Use  . Smoking status: Never Smoker  . Smokeless tobacco: Never Used  Vaping Use  . Vaping Use: Never used  Substance and Sexual Activity  . Alcohol use: Yes    Alcohol/week: 0.0 standard drinks    Comment: 1-2 glasses of red wine  . Drug use: No  . Sexual activity: Not on file  Other Topics Concern  . Not on file  Social History Narrative  .  Not on file   Social Determinants of Health   Financial Resource Strain: Not on file  Food Insecurity: Not on file  Transportation Needs: Not on file  Physical Activity: Not on file  Stress: Not on file  Social Connections: Not on file  Intimate Partner Violence: Not on file     BP 132/74   Pulse 67   Ht 5' 9"  (1.753 m)   Wt 154 lb (69.9 kg)   SpO2 99%   BMI 22.74 kg/m   Physical Exam:  Well appearing 74 yo man, NAD HEENT: Unremarkable Neck:  No JVD, no thyromegally Lymphatics:  No adenopathy Back:  No CVA tenderness Lungs:  Clear with no wheezes HEART:  Regular rate rhythm, no murmurs, no rubs, no clicks Abd:  soft, positive bowel sounds, no organomegally, no rebound, no guarding Ext:  2 plus pulses, no edema, no cyanosis, no clubbing Skin:  No rashes no nodules Neuro:  CN II through XII intact, motor grossly intact  EKG - reviewed. NSR with RBBB  ECG - reviewed  Assess/Plan 1. Symptomatic high grade heart block - I have  discussed the treatment options with the patient. The risks/benefits/goals/expectations of PPM insertion was reviewed and he wishes to proceed. 2. PAF - after his PPM is placed, I would recommend uptitration of his cardizem. 3. Coags - we discussed the importance of systemic anti-coag. His CHADSVASC is 2.   Daniel Overlie Taron Conrey,MD

## 2020-08-22 ENCOUNTER — Inpatient Hospital Stay (HOSPITAL_COMMUNITY): Admission: RE | Admit: 2020-08-22 | Payer: Medicare Other | Source: Ambulatory Visit

## 2020-09-06 NOTE — Progress Notes (Signed)
Instructed patient on the following items: Arrival time 0730 Nothing to eat or drink after midnight No meds AM of procedure Responsible person to drive you home and stay with you for 24 hrs Wash with special soap night before and morning of procedure If on anti-coagulant drug instructions Eliquis 12/31

## 2020-09-07 ENCOUNTER — Other Ambulatory Visit (HOSPITAL_COMMUNITY)
Admission: RE | Admit: 2020-09-07 | Discharge: 2020-09-07 | Disposition: A | Discharge status: Home / Self Care | Payer: Medicare Other | Source: Ambulatory Visit | Attending: Internal Medicine | Admitting: Internal Medicine

## 2020-09-07 DIAGNOSIS — Z20822 Contact with and (suspected) exposure to covid-19: Secondary | ICD-10-CM | POA: Insufficient documentation

## 2020-09-07 DIAGNOSIS — Z01812 Encounter for preprocedural laboratory examination: Secondary | ICD-10-CM | POA: Insufficient documentation

## 2020-09-07 LAB — SARS CORONAVIRUS 2 (TAT 6-24 HRS): SARS Coronavirus 2: NEGATIVE

## 2020-09-10 ENCOUNTER — Ambulatory Visit (HOSPITAL_COMMUNITY): Payer: Medicare Other

## 2020-09-10 ENCOUNTER — Encounter (HOSPITAL_COMMUNITY)
Admission: RE | Disposition: A | Discharge status: Home / Self Care | Payer: Self-pay | Source: Home / Self Care | Attending: Internal Medicine

## 2020-09-10 ENCOUNTER — Ambulatory Visit (HOSPITAL_COMMUNITY)
Admission: RE | Admit: 2020-09-10 | Discharge: 2020-09-10 | Disposition: A | Discharge status: Home / Self Care | Payer: Medicare Other | Attending: Internal Medicine | Admitting: Internal Medicine

## 2020-09-10 ENCOUNTER — Other Ambulatory Visit: Payer: Self-pay

## 2020-09-10 ENCOUNTER — Encounter (HOSPITAL_COMMUNITY): Payer: Self-pay | Admitting: Internal Medicine

## 2020-09-10 DIAGNOSIS — R001 Bradycardia, unspecified: Secondary | ICD-10-CM | POA: Diagnosis not present

## 2020-09-10 DIAGNOSIS — I480 Paroxysmal atrial fibrillation: Secondary | ICD-10-CM | POA: Diagnosis not present

## 2020-09-10 DIAGNOSIS — Z79899 Other long term (current) drug therapy: Secondary | ICD-10-CM | POA: Insufficient documentation

## 2020-09-10 DIAGNOSIS — Z950 Presence of cardiac pacemaker: Secondary | ICD-10-CM

## 2020-09-10 DIAGNOSIS — I442 Atrioventricular block, complete: Secondary | ICD-10-CM | POA: Diagnosis not present

## 2020-09-10 DIAGNOSIS — I441 Atrioventricular block, second degree: Secondary | ICD-10-CM | POA: Diagnosis not present

## 2020-09-10 DIAGNOSIS — R21 Rash and other nonspecific skin eruption: Secondary | ICD-10-CM | POA: Diagnosis not present

## 2020-09-10 DIAGNOSIS — Z7901 Long term (current) use of anticoagulants: Secondary | ICD-10-CM | POA: Diagnosis not present

## 2020-09-10 DIAGNOSIS — J439 Emphysema, unspecified: Secondary | ICD-10-CM | POA: Diagnosis not present

## 2020-09-10 HISTORY — PX: PACEMAKER IMPLANT: EP1218

## 2020-09-10 SURGERY — PACEMAKER IMPLANT

## 2020-09-10 MED ORDER — LIDOCAINE HCL (PF) 1 % IJ SOLN
INTRAMUSCULAR | Status: AC
  Filled 2020-09-10: qty 60

## 2020-09-10 MED ORDER — FENTANYL CITRATE (PF) 100 MCG/2ML IJ SOLN
INTRAMUSCULAR | Status: AC
  Filled 2020-09-10: qty 2

## 2020-09-10 MED ORDER — LIDOCAINE HCL (PF) 1 % IJ SOLN
INTRAMUSCULAR | Status: DC | PRN
  Administered 2020-09-10: 35 mL

## 2020-09-10 MED ORDER — HEPARIN (PORCINE) IN NACL 1000-0.9 UT/500ML-% IV SOLN
INTRAVENOUS | Status: DC | PRN
  Administered 2020-09-10: 500 mL

## 2020-09-10 MED ORDER — MIDAZOLAM HCL 5 MG/5ML IJ SOLN
INTRAMUSCULAR | Status: DC | PRN
  Administered 2020-09-10: 2 mg via INTRAVENOUS
  Administered 2020-09-10: 1 mg via INTRAVENOUS

## 2020-09-10 MED ORDER — SODIUM CHLORIDE 0.9 % IV SOLN: 80.0000 mg | INTRAVENOUS | Status: DC

## 2020-09-10 MED ORDER — FENTANYL CITRATE (PF) 100 MCG/2ML IJ SOLN
INTRAMUSCULAR | Status: DC | PRN
  Administered 2020-09-10: 25 ug via INTRAVENOUS
  Administered 2020-09-10: 12.5 ug via INTRAVENOUS

## 2020-09-10 MED ORDER — CEFAZOLIN SODIUM-DEXTROSE 2-4 GM/100ML-% IV SOLN: 2.0000 g | INTRAVENOUS | Status: DC

## 2020-09-10 MED ORDER — ONDANSETRON HCL 4 MG/2ML IJ SOLN: 4.0000 mg | Freq: Four times a day (QID) | INTRAMUSCULAR | Status: DC | PRN

## 2020-09-10 MED ORDER — CEFAZOLIN SODIUM-DEXTROSE 1-4 GM/50ML-% IV SOLN
1.0000 g | Freq: Four times a day (QID) | INTRAVENOUS | Status: DC
  Administered 2020-09-10: 1 g via INTRAVENOUS
  Filled 2020-09-10: qty 50

## 2020-09-10 MED ORDER — CEFAZOLIN SODIUM-DEXTROSE 2-3 GM-%(50ML) IV SOLR
INTRAVENOUS | Status: DC | PRN
  Administered 2020-09-10: 2 g via INTRAVENOUS

## 2020-09-10 MED ORDER — MIDAZOLAM HCL 5 MG/5ML IJ SOLN
INTRAMUSCULAR | Status: AC
  Filled 2020-09-10: qty 5

## 2020-09-10 MED ORDER — ACETAMINOPHEN 325 MG PO TABS
325.0000 mg | ORAL_TABLET | ORAL | Status: DC | PRN
  Administered 2020-09-10: 650 mg via ORAL
  Filled 2020-09-10: qty 2

## 2020-09-10 MED ORDER — HEPARIN (PORCINE) IN NACL 1000-0.9 UT/500ML-% IV SOLN
INTRAVENOUS | Status: AC
  Filled 2020-09-10: qty 500

## 2020-09-10 MED ORDER — CEFAZOLIN SODIUM-DEXTROSE 2-4 GM/100ML-% IV SOLN
INTRAVENOUS | Status: AC
  Filled 2020-09-10: qty 100

## 2020-09-10 MED ORDER — SODIUM CHLORIDE 0.9 % IV SOLN
INTRAVENOUS | Status: AC
  Filled 2020-09-10: qty 2

## 2020-09-10 MED ORDER — SODIUM CHLORIDE 0.9 % IV SOLN: INTRAVENOUS | Status: DC

## 2020-09-10 MED ORDER — SODIUM CHLORIDE 0.9 % IV SOLN
INTRAVENOUS | Status: DC | PRN
  Administered 2020-09-10: 500 mL

## 2020-09-10 MED ORDER — CHLORHEXIDINE GLUCONATE 4 % EX LIQD
4.0000 "application " | Freq: Once | CUTANEOUS | Status: DC
  Filled 2020-09-10: qty 60

## 2020-09-10 SURGICAL SUPPLY — 12 items
CABLE SURGICAL S-101-97-12 (CABLE) ×2 IMPLANT
CATH RIGHTSITE C315HIS02 (CATHETERS) ×2 IMPLANT
IPG PACE AZUR XT DR MRI W1DR01 (Pacemaker) ×1 IMPLANT
LEAD CAPSURE NOVUS 5076-52CM (Lead) ×2 IMPLANT
LEAD SELECT SECURE 3830 383069 (Lead) ×1 IMPLANT
PACE AZURE XT DR MRI W1DR01 (Pacemaker) ×2 IMPLANT
PAD PRO RADIOLUCENT 2001M-C (PAD) ×2 IMPLANT
SELECT SECURE 3830 383069 (Lead) ×2 IMPLANT
SHEATH 7FR PRELUDE SNAP 13 (SHEATH) ×4 IMPLANT
SLITTER 6232ADJ (MISCELLANEOUS) ×2 IMPLANT
TRAY PACEMAKER INSERTION (PACKS) ×2 IMPLANT
WIRE HI TORQ VERSACORE-J 145CM (WIRE) ×2 IMPLANT

## 2020-09-10 NOTE — Progress Notes (Signed)
Dr. Lovena Le made aware of x-ray results. No further orders received.

## 2020-09-10 NOTE — Interval H&P Note (Signed)
History and Physical Interval Note:  09/10/2020 9:52 AM  [facility]  has presented today for surgery, with the diagnosis of bradicardia.  The various methods of treatment have been discussed with the patient and family. After consideration of risks, benefits and other options for treatment, the patient has consented to  Procedure(s): PACEMAKER IMPLANT (N/A) as a surgical intervention.  The patient's history has been reviewed, patient examined, no change in status, stable for surgery.  I have reviewed the patient's chart and labs.  Questions were answered to the patient's satisfaction.     Cristopher Peru

## 2020-09-10 NOTE — Discharge Instructions (Signed)
° ° ° °                                                                                                           ° ° °  Supplemental Discharge Instructions for  Pacemaker/Defibrillator Patients  Tomorrow, 09/11/20, send in a device transmission  Activity No heavy lifting or vigorous activity with your left/right arm for 6 to 8 weeks.  Do not raise your left/right arm above your head for one week.  Gradually raise your affected arm as drawn below.             09/14/20                       09/15/20                      09/16/20                     09/17/20               __  NO DRIVING for 1 week    ; you may begin driving on  4/82/50  .  WOUND CARE - Keep the wound area clean and dry.  Do not get this area wet , no showers for one week; you may shower on  09/17/20   . - Tomorrow, 09/11/20, remove the arm sling - Tomorrow, 09/11/20 remove the outer plastic bandage.  Underneath the plastic bandage there are steri strips (paper tapes), DO NOT remove these. - The tape/steri-strips on your wound will fall off; do not pull them off.  No bandage is needed on the site.  DO  NOT apply any creams, oils, or ointments to the wound area. - If you notice any drainage or discharge from the wound, any swelling or bruising at the site, or you develop a fever > 101? F after you are discharged home, call the office at once.  Special Instructions - You are still able to use cellular telephones; use the ear opposite the side where you have your pacemaker/defibrillator.  Avoid carrying your cellular phone near your device. - When traveling through airports, show security personnel your identification card to avoid being screened in the metal detectors.  Ask the security personnel to use the hand wand. - Avoid arc welding equipment, MRI testing (magnetic resonance imaging), TENS units (transcutaneous nerve stimulators).  Call the office for questions about other devices. - Avoid electrical appliances that are in poor condition  or are not properly grounded. - Microwave ovens are safe to be near or to operate.

## 2020-09-11 ENCOUNTER — Ambulatory Visit: Payer: Medicare Other | Admitting: Cardiovascular Disease

## 2020-09-11 ENCOUNTER — Telehealth: Payer: Self-pay | Admitting: Emergency Medicine

## 2020-09-11 NOTE — Telephone Encounter (Signed)
Patient reports steri-strips clean and intact with no swelling, drainage, bleeding or pain at wound site. Education done on lifting restrictions, arm movement, remote monitor and importance of wound check appointment. Questions answered and Device Clinic direct # and hours provided.

## 2020-09-20 ENCOUNTER — Other Ambulatory Visit: Payer: Self-pay

## 2020-09-20 ENCOUNTER — Ambulatory Visit (INDEPENDENT_AMBULATORY_CARE_PROVIDER_SITE_OTHER): Payer: Medicare Other | Admitting: Emergency Medicine

## 2020-09-20 DIAGNOSIS — I441 Atrioventricular block, second degree: Secondary | ICD-10-CM

## 2020-09-20 LAB — CUP PACEART INCLINIC DEVICE CHECK
Battery Remaining Longevity: 142 mo
Battery Voltage: 3.22 V
Brady Statistic AP VP Percent: 3.8 %
Brady Statistic AP VS Percent: 0.14 %
Brady Statistic AS VP Percent: 92.63 %
Brady Statistic AS VS Percent: 3.42 %
Brady Statistic RA Percent Paced: 4.03 %
Brady Statistic RV Percent Paced: 96.44 %
Date Time Interrogation Session: 20220113132124
Implantable Lead Implant Date: 20220103
Implantable Lead Implant Date: 20220103
Implantable Lead Location: 753859
Implantable Lead Location: 753860
Implantable Lead Model: 3830
Implantable Lead Model: 5076
Implantable Pulse Generator Implant Date: 20220103
Lead Channel Impedance Value: 399 Ohm
Lead Channel Impedance Value: 399 Ohm
Lead Channel Impedance Value: 513 Ohm
Lead Channel Impedance Value: 551 Ohm
Lead Channel Pacing Threshold Amplitude: 0.5 V
Lead Channel Pacing Threshold Amplitude: 1 V
Lead Channel Pacing Threshold Pulse Width: 0.4 ms
Lead Channel Pacing Threshold Pulse Width: 0.4 ms
Lead Channel Sensing Intrinsic Amplitude: 1.25 mV
Lead Channel Sensing Intrinsic Amplitude: 19.625 mV
Lead Channel Setting Pacing Amplitude: 3.5 V
Lead Channel Setting Pacing Amplitude: 3.5 V
Lead Channel Setting Pacing Pulse Width: 0.4 ms
Lead Channel Setting Sensing Sensitivity: 1.2 mV

## 2020-09-20 NOTE — Progress Notes (Signed)
Wound check appointment. Steri-strips removed. Wound without redness or edema. Incision edges approximated, wound well healed. Normal device function. RA Threshold, sensing, and impedances consistent with implant measurements. Device programmed at 3.5V for extra safety margin until 3 month visit. Histogram distribution appropriate for patient and level of activity. No mode switches or high ventricular rates noted. Patient educated about wound care, arm mobility, lifting restrictions. ROV 12/12/20 with Dr. Lovena Le.

## 2020-09-21 ENCOUNTER — Telehealth: Payer: Self-pay

## 2020-09-21 NOTE — Telephone Encounter (Signed)
Transmission received.

## 2020-09-21 NOTE — Telephone Encounter (Signed)
The pt left a message stating his heart did something (weird) for about 5 minutes or so. I asked the patient to send a transmission with his monitor when he get home for the nurse to review and give him a call back.

## 2020-09-21 NOTE — Telephone Encounter (Signed)
Patient transmission reviewed. Device function WNL, no episodes of high atrial or ventricular rates, presenting rhythm AS/ VP with FFRW. Patient reports he had episode sitting in his car this morning where he had a " crazy sensation" in his chest for 5 minutes. He reports no CP, no chest pressure, no syncope,no SOB. He reports feeling a "little " light headed and reports that the EKG function on his watch looked different from usual. He was concerned that he was in AF at time of episode due to how he was feeling. Attempted to reassure patient that transmission showed no issues or concerns . He reports he is not happy at the report due to how he felt and that there were no answers from the pacemaker transmission to correlate with his symptom, or his watch EKG .

## 2020-09-26 DIAGNOSIS — D1801 Hemangioma of skin and subcutaneous tissue: Secondary | ICD-10-CM | POA: Diagnosis not present

## 2020-09-26 DIAGNOSIS — L821 Other seborrheic keratosis: Secondary | ICD-10-CM | POA: Diagnosis not present

## 2020-09-26 DIAGNOSIS — D2239 Melanocytic nevi of other parts of face: Secondary | ICD-10-CM | POA: Diagnosis not present

## 2020-09-26 DIAGNOSIS — D225 Melanocytic nevi of trunk: Secondary | ICD-10-CM | POA: Diagnosis not present

## 2020-10-05 ENCOUNTER — Telehealth: Payer: Self-pay | Admitting: Internal Medicine

## 2020-10-05 ENCOUNTER — Other Ambulatory Visit: Payer: Self-pay

## 2020-10-05 ENCOUNTER — Ambulatory Visit (INDEPENDENT_AMBULATORY_CARE_PROVIDER_SITE_OTHER): Payer: Medicare Other | Admitting: Emergency Medicine

## 2020-10-05 DIAGNOSIS — I441 Atrioventricular block, second degree: Secondary | ICD-10-CM

## 2020-10-05 NOTE — Progress Notes (Signed)
Patient seen in device clinic today for 2 stitches removed/snipped. No redness, swelling, drainage noted to site. Wound care education provided. Patient advised to call with further questions or concerns.

## 2020-10-05 NOTE — Telephone Encounter (Signed)
Patient called to come in to have site assessed. Device clinic apt. Made today at 10:30. Location and time discussed with patient.

## 2020-10-05 NOTE — Patient Instructions (Signed)
Please call if you see any further stitch present. Please call if you see any redness, drainage, swelling, fever or chills.  [facility] [phone]

## 2020-10-05 NOTE — Telephone Encounter (Signed)
New Message:     Pt said he had pacemaker put in 09-10-20. He says he have a stitch sticking out from his incision. He would like to come in so it can be looked at please. He said you can see message he sent in My Chart to Dr Lovena Le.

## 2020-10-29 ENCOUNTER — Other Ambulatory Visit: Payer: Self-pay

## 2020-10-29 MED ORDER — DILTIAZEM HCL ER COATED BEADS 180 MG PO CP24: 180.0000 mg | ORAL_CAPSULE | Freq: Every day | ORAL | 3 refills | Status: DC

## 2020-11-28 DIAGNOSIS — K5190 Ulcerative colitis, unspecified, without complications: Secondary | ICD-10-CM | POA: Diagnosis not present

## 2020-12-04 DIAGNOSIS — H02423 Myogenic ptosis of bilateral eyelids: Secondary | ICD-10-CM | POA: Diagnosis not present

## 2020-12-04 DIAGNOSIS — H4423 Degenerative myopia, bilateral: Secondary | ICD-10-CM | POA: Diagnosis not present

## 2020-12-04 DIAGNOSIS — H33322 Round hole, left eye: Secondary | ICD-10-CM | POA: Diagnosis not present

## 2020-12-04 DIAGNOSIS — H43813 Vitreous degeneration, bilateral: Secondary | ICD-10-CM | POA: Diagnosis not present

## 2020-12-04 DIAGNOSIS — H1045 Other chronic allergic conjunctivitis: Secondary | ICD-10-CM | POA: Diagnosis not present

## 2020-12-04 DIAGNOSIS — H57813 Brow ptosis, bilateral: Secondary | ICD-10-CM | POA: Diagnosis not present

## 2020-12-04 DIAGNOSIS — H2513 Age-related nuclear cataract, bilateral: Secondary | ICD-10-CM | POA: Diagnosis not present

## 2020-12-04 DIAGNOSIS — H11823 Conjunctivochalasis, bilateral: Secondary | ICD-10-CM | POA: Diagnosis not present

## 2020-12-05 DIAGNOSIS — Z23 Encounter for immunization: Secondary | ICD-10-CM | POA: Diagnosis not present

## 2020-12-10 ENCOUNTER — Ambulatory Visit (INDEPENDENT_AMBULATORY_CARE_PROVIDER_SITE_OTHER): Payer: Medicare Other

## 2020-12-10 DIAGNOSIS — I441 Atrioventricular block, second degree: Secondary | ICD-10-CM | POA: Diagnosis not present

## 2020-12-11 ENCOUNTER — Encounter (INDEPENDENT_AMBULATORY_CARE_PROVIDER_SITE_OTHER): Payer: Medicare Other | Admitting: Ophthalmology

## 2020-12-11 ENCOUNTER — Other Ambulatory Visit: Payer: Self-pay

## 2020-12-11 DIAGNOSIS — H2513 Age-related nuclear cataract, bilateral: Secondary | ICD-10-CM

## 2020-12-11 DIAGNOSIS — H43813 Vitreous degeneration, bilateral: Secondary | ICD-10-CM

## 2020-12-11 LAB — CUP PACEART REMOTE DEVICE CHECK
Battery Remaining Longevity: 161 mo
Battery Voltage: 3.21 V
Brady Statistic AP VP Percent: 2.3 %
Brady Statistic AP VS Percent: 0.17 %
Brady Statistic AS VP Percent: 55.01 %
Brady Statistic AS VS Percent: 42.53 %
Brady Statistic RA Percent Paced: 2.53 %
Brady Statistic RV Percent Paced: 57.31 %
Date Time Interrogation Session: 20220404044101
Implantable Lead Implant Date: 20220103
Implantable Lead Implant Date: 20220103
Implantable Lead Location: 753859
Implantable Lead Location: 753860
Implantable Lead Model: 3830
Implantable Lead Model: 5076
Implantable Pulse Generator Implant Date: 20220103
Lead Channel Impedance Value: 342 Ohm
Lead Channel Impedance Value: 361 Ohm
Lead Channel Impedance Value: 437 Ohm
Lead Channel Impedance Value: 513 Ohm
Lead Channel Pacing Threshold Amplitude: 0.5 V
Lead Channel Pacing Threshold Amplitude: 0.75 V
Lead Channel Pacing Threshold Pulse Width: 0.4 ms
Lead Channel Pacing Threshold Pulse Width: 0.4 ms
Lead Channel Sensing Intrinsic Amplitude: 1.25 mV
Lead Channel Sensing Intrinsic Amplitude: 1.25 mV
Lead Channel Sensing Intrinsic Amplitude: 23.25 mV
Lead Channel Sensing Intrinsic Amplitude: 23.25 mV
Lead Channel Setting Pacing Amplitude: 1.5 V
Lead Channel Setting Pacing Amplitude: 2 V
Lead Channel Setting Pacing Pulse Width: 0.4 ms
Lead Channel Setting Sensing Sensitivity: 1.2 mV

## 2020-12-12 ENCOUNTER — Ambulatory Visit (INDEPENDENT_AMBULATORY_CARE_PROVIDER_SITE_OTHER): Payer: Medicare Other | Admitting: Internal Medicine

## 2020-12-12 ENCOUNTER — Encounter: Payer: Self-pay | Admitting: Internal Medicine

## 2020-12-12 VITALS — BP 116/70 | HR 65 | Ht 69.0 in | Wt 156.0 lb

## 2020-12-12 DIAGNOSIS — Z950 Presence of cardiac pacemaker: Secondary | ICD-10-CM | POA: Diagnosis not present

## 2020-12-12 DIAGNOSIS — I441 Atrioventricular block, second degree: Secondary | ICD-10-CM

## 2020-12-12 DIAGNOSIS — I480 Paroxysmal atrial fibrillation: Secondary | ICD-10-CM | POA: Diagnosis not present

## 2020-12-12 NOTE — Progress Notes (Signed)
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 In the interim, he notes very rare palpitations. He has returned to his very active lifestyle. He has stopped his eliquis as he has only had brief atrial fib and he has a propensity to fall. Also there was a question of retinal problems.   Allergies  Allergen Reactions  . Concerta [Methylphenidate] Other (See Comments)    Burning mouth  . Lialda [Mesalamine] Diarrhea     Current Outpatient Medications  Medication Sig Dispense Refill  . acetaminophen (TYLENOL) 500 MG tablet Take 500 mg by mouth at bedtime.     Marland Kitchen atorvastatin (LIPITOR) 10 MG tablet Take 10 mg by mouth daily.  11  . DILT-XR 120 MG 24 hr capsule Take 120 mg by mouth 2 (two) times daily.    . diphenhydrAMINE (BENADRYL) 25 MG tablet Take 25 mg by mouth at bedtime.     . Melatonin 10 MG TABS Take 10 mg by mouth at bedtime.    Marland Kitchen apixaban (ELIQUIS) 5 MG TABS tablet Take 1 tablet (5 mg total) by mouth 2 (two) times daily. (Patient not taking: Reported on 12/12/2020) 180 tablet 1   No current facility-administered medications for this visit.     Past Medical History:  Diagnosis Date  . A-fib ([place])   . ADHD (attention deficit hyperactivity disorder)   . 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 Chriss Czar [place]  . Chronic kidney disease    borderline stage III  . Fissure, anal   . Giardia    working in [place]  . Hyperlipidemia   . Infectious mononucleosis   . Low back pain   . Presbycusis of left ear   . Spinal stenosis of lumbar region   . Ulcerative colitis ([place])     ROS:   All systems reviewed and negative except as noted in the HPI.   Past Surgical History:  Procedure Laterality Date  . ANAL FISSURE REPAIR    . APPENDECTOMY    . PACEMAKER IMPLANT N/A  09/10/2020   Procedure: PACEMAKER IMPLANT;  Surgeon: Evans Lance, MD;  Location: [place] [facility];  Service: Cardiovascular;  Laterality: N/A;  . PROSTATE SURGERY    . ROTATOR CUFF REPAIR Left   . VARICOCELE EXCISION Right      Family History  Problem Relation Age of Onset  . Stroke Mother 28  . Heart attack Father      Social History   Socioeconomic History  . Marital status: Married    Spouse name: Not on file  . Number of children: Not on file  . Years of education: Not on file  . Highest education level: Not on file  Occupational History  . Not on file  Tobacco Use  . Smoking status: Never Smoker  . Smokeless tobacco: Never Used  Vaping Use  . Vaping Use: Never used  Substance and Sexual Activity  . Alcohol use: Yes    Alcohol/week: 0.0 standard drinks    Comment: 1-2 glasses of red wine  . Drug use: No  . Sexual activity: Not on file  Other Topics Concern  . Not on file  Social History Narrative  . Not on file   Social Determinants of Health  Financial Resource Strain: Not on file  Food Insecurity: Not on file  Transportation Needs: Not on file  Physical Activity: Not on file  Stress: Not on file  Social Connections: Not on file  Intimate Partner Violence: Not on file     BP 116/70   Pulse 65   Ht 5' 9"  (1.753 m)   Wt 156 lb (70.8 kg)   BMI 23.04 kg/m   Physical Exam:  Well appearing NAD HEENT: Unremarkable Neck:  No JVD, no thyromegally Lymphatics:  No adenopathy Back:  No CVA tenderness Lungs:  Clear with no wheezes HEART:  Regular rate rhythm, no murmurs, no rubs, no clicks Abd:  soft, positive bowel sounds, no organomegally, no rebound, no guarding Ext:  2 plus pulses, no edema, no cyanosis, no clubbing Skin:  No rashes no nodules Neuro:  CN II through XII intact, motor grossly intact  EKG - nsr with rbbb  DEVICE  Normal device function.  See PaceArt for details.   Assess/Plan: 1. 2:1 AV block - he appears to be conduction  today. He is pacing about 50% of the time in the RV/Left bundle area. 2. PAF - he has only had 20 minutes of atrial fib or less at a time and only a couple of episodes. He will continue his cardizem.  Carleene Overlie Ngina Royer,MD

## 2020-12-12 NOTE — Patient Instructions (Signed)
Medication Instructions:  Your physician recommends that you continue on your current medications as directed. Please refer to the Current Medication list given to you today.  Labwork: None ordered.  Testing/Procedures: None ordered.  Follow-Up: Your physician wants you to follow-up in: one year with Cristopher Peru, MD or one of the following Advanced Practice Providers on your designated Care Team:    Chanetta Marshall, NP  Tommye Standard, PA-C  Legrand Como "Jonni Sanger" [place], [place]  Remote monitoring is used to monitor your Pacemaker from home. This monitoring reduces the number of office visits required to check your device to one time per year. It allows [place] to keep an eye on the functioning of your device to ensure it is working properly. You are scheduled for a device check from home on 06/10/2021. You may send your transmission at any time that day. If you have a wireless device, the transmission will be sent automatically. After your physician reviews your transmission, you will receive a postcard with your next transmission date.  Any Other Special Instructions Will Be Listed Below (If Applicable).  If you need a refill on your cardiac medications before your next appointment, please call your pharmacy.

## 2020-12-20 NOTE — Progress Notes (Signed)
Remote pacemaker transmission.   

## 2021-01-14 ENCOUNTER — Telehealth: Payer: Self-pay | Admitting: Cardiovascular Disease

## 2021-01-14 NOTE — Telephone Encounter (Signed)
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 He says he needs a letter from Dr Acie Fredrickson so he can get a refund on an [employer]. He will give your more details when you call.lma

## 2021-01-14 NOTE — Telephone Encounter (Signed)
RN returned call to patient. Patient states that he has a letter for Dr. Acie Fredrickson to review prior to his appt on 5/13 regarding his diagnosis of bradycardia and heart block and a prior trip ski to [place]. Patient states he will bring the letter over this afternoon to give [place] time to review prior to his upcoming appointment.

## 2021-01-16 ENCOUNTER — Encounter: Payer: Self-pay | Admitting: Cardiovascular Disease

## 2021-01-16 NOTE — Progress Notes (Signed)
Cardiology Office Note   Date:  01/18/2021   ID:  EILAN MCINERNY, DOB 1945/12/19, MRN [id number]  PCP:  Lajean Manes, MD  Cardiologist:   Mertie Moores, MD   Chief Complaint  Patient presents with  . Atrial Fibrillation   1. Problem List:  1. Paroxysmal Atrial fib 2. Hyperlipidemia 3. Prostate surgery-status post robotic prostatectomy 4. Ulcerative colitis    Daniel Payne is a 75 y.o. 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  Has not fully controlled the bleeding. Best controlled by steroids.    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 He was cycling and went into rapid Afib with rate of 170.  He converted back to NSR in 3-4 hours. Had another episode of Afib while working in his garden.  Went to Dr.  Carlyle Lipa office . Afib was documeneted.  Was started on Dilt 30 QID.  Converted to NSR the next day He stopped the Dilt a week or so later.  Has developed episodes of sinus tachycardia recently.  Also associated with some chest tightness.  Does not last long.   This am he had very slight chest tightness while he was walking.  Lasted 5  Minutes.  No diaphoresis, no radiation.  Drinks wine with dinner.  02/08/2015:  Skin is doing well. He has paroxysmal atrial fibrillation. He has not wanted to start anticoagulation yet. He's had some problems with colitis. He had a stress Myoview study as part of his workup for some vague chest tightness. Myoview revealed no evidence of ischemia. He had normal left ventricle systolic function with an ejection fraction of 75%. Has been started on Humira for Ulcerative colits . Has been cycling quite a bit.  No issues.   No recurrent episodes of atrial fib .  He thinks it may have been related to the high dose prednisone   Sept. 7, 2016:  Daniel Payne is doing well. He stopped his Eliquis  Doing well.   Cycling regularly .  November 14, 2015:   Doing well.  Still cycling .    Just got back from [facility] for snow skiing .  [name], [place], )  Very active .  His UC is well controlled  November 26, 2016  Daniel Payne is doing well Spent the past 8 days skiing our in [place] 20-25 days a year now  Does well even at high altitude. Also hiked last year in [place] without any problems   Jan 06, 2018 :  Daniel Payne is seen today for follow-up of his atrial fibrillation and hyperlipidemia.  He has maintained normal sinus rhythm. Very active.  Skis quite a bit .  Walks 2 -3 miles with the dogs 4 days . Rides 15-20 miles on his bike once a week . No palpitations  Doing well on the Dilt LA 120 mg a day   June 30, 2018:  Daniel Payne is seen as a work in visit today.  He was recently on a trans -atlantic  flight and was awake for 26 hours.  He went into atrial for ablation.  He tried an extra half a diltiazem tablet but he remains in atrial fib .   Slept well but has not converted to NSR   He is planning on going up to his house in [place], [place] for the weekend.  Nov.  21, 2019  Daniel Payne is doing well,   He was seen for persistent AF on Oct. 23 . Started Eliquis He increased his Dilt to 120 BID, took an extra 30 mg of immediate release Dilt Converted to [name] a cold , went back into AF. Increased his Dilt again ( 120 BID + Dilt 30 ) converted back into NSR Continued Elquis for 1 week after wards  Feels great.   Is planning on going to [place] on Dec. 7 to ski.    Jan 10, 2020: Daniel Payne is seen today for follow-up visit.  He has a history of paroxysmal atrial fibrillation.  He has as needed Eliquis that he takes for a week after he has an episode of PAF. No further episodes of PAF after increasing his Diltiazem to 120  Mg CD BID .  Favorite ski spots  [place], [place] - [place]   Nov. 18, 2021: Daniel Payne is seen today for follow up of his PAF., hx of HLD   He has also developed DOE while cycling   For the past 6 weeks, has had increasing fatigue.  Has trouble keeping up with his cycling  friends.  He has noted that his HR does not increase with exercise  O2 sats never drop below 98%.  No CP , pressure, burning, fullness   may need myoview or coronary Ct angio  Has PAF,  Has not had an episode since last year  - not since we increased his diltiazem to 120 CD  BID .  Dec. 3, 2021  Daniel Payne is seen as a work in visit today following his stress myoview.   He had poor chronotropic response with his treadmill.  HR was 70 initially and increased to 71 with ~2 minutes of exercise. We were asked to see him because of this poor chronotropic response.  Normally, Daniel Payne is a very active man.   He cycles ~15 - 20 miles with his cycling friends weekly but has not been able to keep up for the past month.   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   He normally takes Dilt 180 CD but had held this medication the day prior to and the day of the stress test.  The myoview revealed no ischemia He is scheduled to leave for [place] tomorrow AM to ski.  I reviewed the strips with Dr. Caryl Comes.   Our plan is to keep Daniel Payne off the Cardizem and repeat the GXT in a week to see if his chronotropic response improves.  Addendum  Dec. 5: [name] called last night from [place] and said he was flying back to [place].  Despite being off the Cardizem for 3 days , his HR remains slow and he is not able to ski at all.   He has requested that his GXT be scheduled for this upcoming week and to get an EP consult this week if possible.     Jan 18, 2021: Daniel Payne is seen . Hx of SSS. S/p pacer Because of his syncope, weakness, and pacer placement he missed most of the ski season last year. We have written a letter for a refund for his Epic pass.  Very active Rode his bike this week.  Got his HR up to 160 Has  not had atrial fib . 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 Offered to let us pick blueberries   He has been told not to use a chain saw or gas powered weed trimmer    Past Medical History:  Diagnosis Date  . A-fib ([place])   . ADHD (attention deficit hyperactivity disorder)   . Cancer [facility])    prostate dr. Chriss Czar davis  . Chronic kidney disease    borderline stage III  . Fissure, anal   . Giardia    working in [place]  . Hyperlipidemia   . Infectious mononucleosis   . Low back pain   . Presbycusis of left ear   . Spinal stenosis of lumbar region   . Ulcerative colitis [facility])     Past Surgical History:  Procedure Laterality Date  . ANAL FISSURE REPAIR    . APPENDECTOMY    . PACEMAKER IMPLANT N/A 09/10/2020   Procedure: PACEMAKER IMPLANT;  Surgeon: Evans Lance, MD;  Location: [place] [facility];  Service: Cardiovascular;  Laterality: N/A;  . PROSTATE SURGERY    . ROTATOR CUFF REPAIR Left   . VARICOCELE EXCISION Right      Current Outpatient Medications  Medication Sig Dispense Refill  . acetaminophen (TYLENOL) 500 MG tablet Take 500 mg by mouth at bedtime.     Marland Kitchen atorvastatin (LIPITOR) 10 MG tablet Take 10 mg by mouth daily.  11  . DILT-XR 120 MG 24 hr capsule Take 120 mg by mouth 2 (two) times daily.    . diphenhydrAMINE (BENADRYL) 25 MG tablet Take 25 mg by mouth at bedtime.     . Melatonin 10 MG TABS Take 10 mg by mouth at bedtime.     No current facility-administered medications for this visit.    Allergies:   Concerta [methylphenidate] and Lialda [mesalamine]    Social History:  The patient  reports that he has never smoked. He has never used smokeless tobacco. He reports current alcohol use. He reports that he does not use drugs.   Family History:  The patient's family history includes Heart attack in his father; Stroke (age of onset: 69) in his mother.     ROS:  Noted in current history, otherwise review of systems is negative.  Physical Exam: Blood pressure (!) 118/50, pulse 74, height 5' 9"  (1.753 m), weight 155 lb 3.2 oz (70.4 kg), SpO2 98 %.  GEN:  Well nourished, well developed in no acute distress HEENT: Normal NECK: No JVD; No carotid bruits LYMPHATICS: No lymphadenopathy CARDIAC: RRR ,  Pacer area is well healed.  No hematoma,  Non tender  RESPIRATORY:  Clear to auscultation without rales, wheezing or rhonchi  ABDOMEN: Soft, non-tender, non-distended MUSCULOSKELETAL:  No edema; No deformity  SKIN: Warm and dry NEUROLOGIC:  Alert and oriented x 3   EKG:       Recent Labs: 08/21/2020: BUN 26; Creatinine, Ser 1.36; Hemoglobin 14.4; Platelets 217; Potassium 4.8; Sodium 139    Lipid Panel No results found for: CHOL, TRIG, HDL, CHOLHDL, VLDL, LDLCALC, LDLDIRECT    Wt Readings from Last 3 Encounters:  01/18/21 155 lb 3.2 oz (70.4 kg)  12/12/20 156 lb (70.8 kg)  09/10/20 148 lb (67.1 kg)      Other studies Reviewed: Additional studies/ records that were reviewed today include: . Review of the above records demonstrates:    ASSESSMENT AND PLAN:  1.  Paroxysmal atrial fibrillation / SSS  S/p pacer  He has been told not to use a chain saw.  Will ask Dr. Lovena Le for clarification of this  He is not pacer dependent    2.  Shortness of breath with exertion:  This has resolved following pacer placement . He is cycling and is very active. Looking forward to ski season again this year.    3.  Hyperlipidemia :   On atorvastatin 10 mg a day Labs from last Nov look stable   Current medicines are reviewed at length with the patient today.  The patient does not have concerns regarding medicines.  The following changes have been made:      Disposition:       Signed, Mertie Moores, MD  01/18/2021 8:28 AM    [facility] [place], [place],   [address] Phone: [phone];  Fax: [phone]

## 2021-01-18 ENCOUNTER — Other Ambulatory Visit: Payer: Self-pay

## 2021-01-18 ENCOUNTER — Encounter: Payer: Self-pay | Admitting: Cardiovascular Disease

## 2021-01-18 ENCOUNTER — Ambulatory Visit (INDEPENDENT_AMBULATORY_CARE_PROVIDER_SITE_OTHER): Payer: Medicare Other | Admitting: Cardiovascular Disease

## 2021-01-18 VITALS — BP 118/50 | HR 74 | Ht 69.0 in | Wt 155.2 lb

## 2021-01-18 DIAGNOSIS — I480 Paroxysmal atrial fibrillation: Secondary | ICD-10-CM | POA: Diagnosis not present

## 2021-01-18 DIAGNOSIS — E7800 Pure hypercholesterolemia, unspecified: Secondary | ICD-10-CM

## 2021-01-18 NOTE — Patient Instructions (Signed)
Medication Instructions:  Your provider recommends that you continue on your current medications as directed. Please refer to the Current Medication list given to you today.   *If you need a refill on your cardiac medications before your next appointment, please call your pharmacy*  Follow-Up: At [facility], you and your health needs are our priority.  As part of our continuing mission to provide you with exceptional heart care, we have created designated Provider Care Teams.  These Care Teams include your primary Cardiologist (physician) and Advanced Practice Providers (APPs -  Physician Assistants and Nurse Practitioners) who all work together to provide you with the care you need, when you need it. Your next appointment:   12 month(s) The format for your next appointment:   In Person Provider:   You may see Mertie Moores, MD or one of the following Advanced Practice Providers on your designated Care Team:    Richardson Dopp, PA-C  [place], [place]

## 2021-01-22 DIAGNOSIS — I495 Sick sinus syndrome: Secondary | ICD-10-CM | POA: Diagnosis not present

## 2021-01-22 DIAGNOSIS — D6869 Other thrombophilia: Secondary | ICD-10-CM | POA: Diagnosis not present

## 2021-01-22 DIAGNOSIS — Z950 Presence of cardiac pacemaker: Secondary | ICD-10-CM | POA: Diagnosis not present

## 2021-01-22 DIAGNOSIS — I480 Paroxysmal atrial fibrillation: Secondary | ICD-10-CM | POA: Diagnosis not present

## 2021-02-04 ENCOUNTER — Other Ambulatory Visit: Payer: Self-pay | Admitting: Cardiovascular Disease

## 2021-02-25 IMAGING — CR DG CHEST 2V
2 series · 2 of 2 positions shown · non-contrast
Comparison: Chest radiograph dated 10/26/2018.

CLINICAL DATA: 74-year-old male status post pacemaker placement.

EXAM:
CHEST - 2 VIEW

[w chest pa]
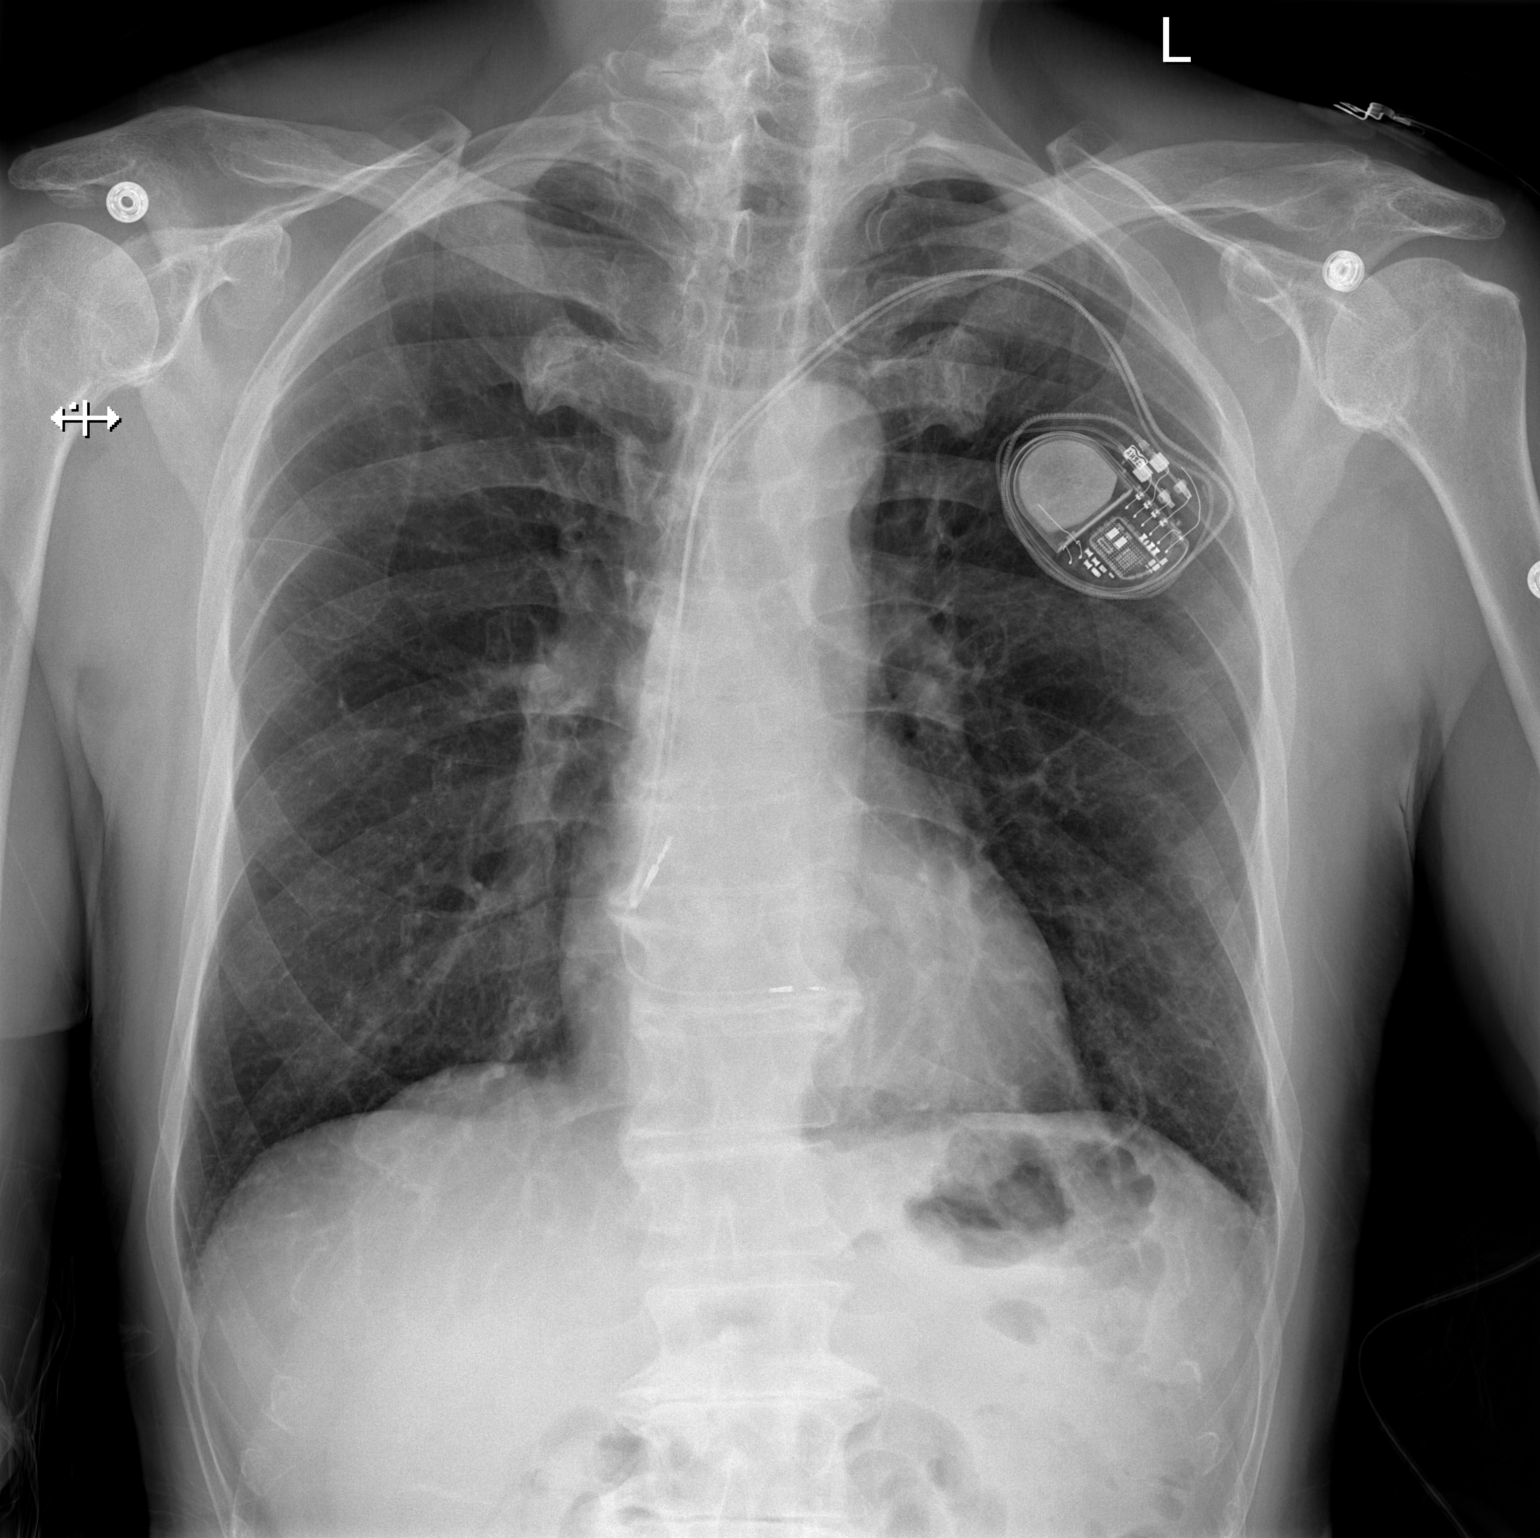

[w chest lat]
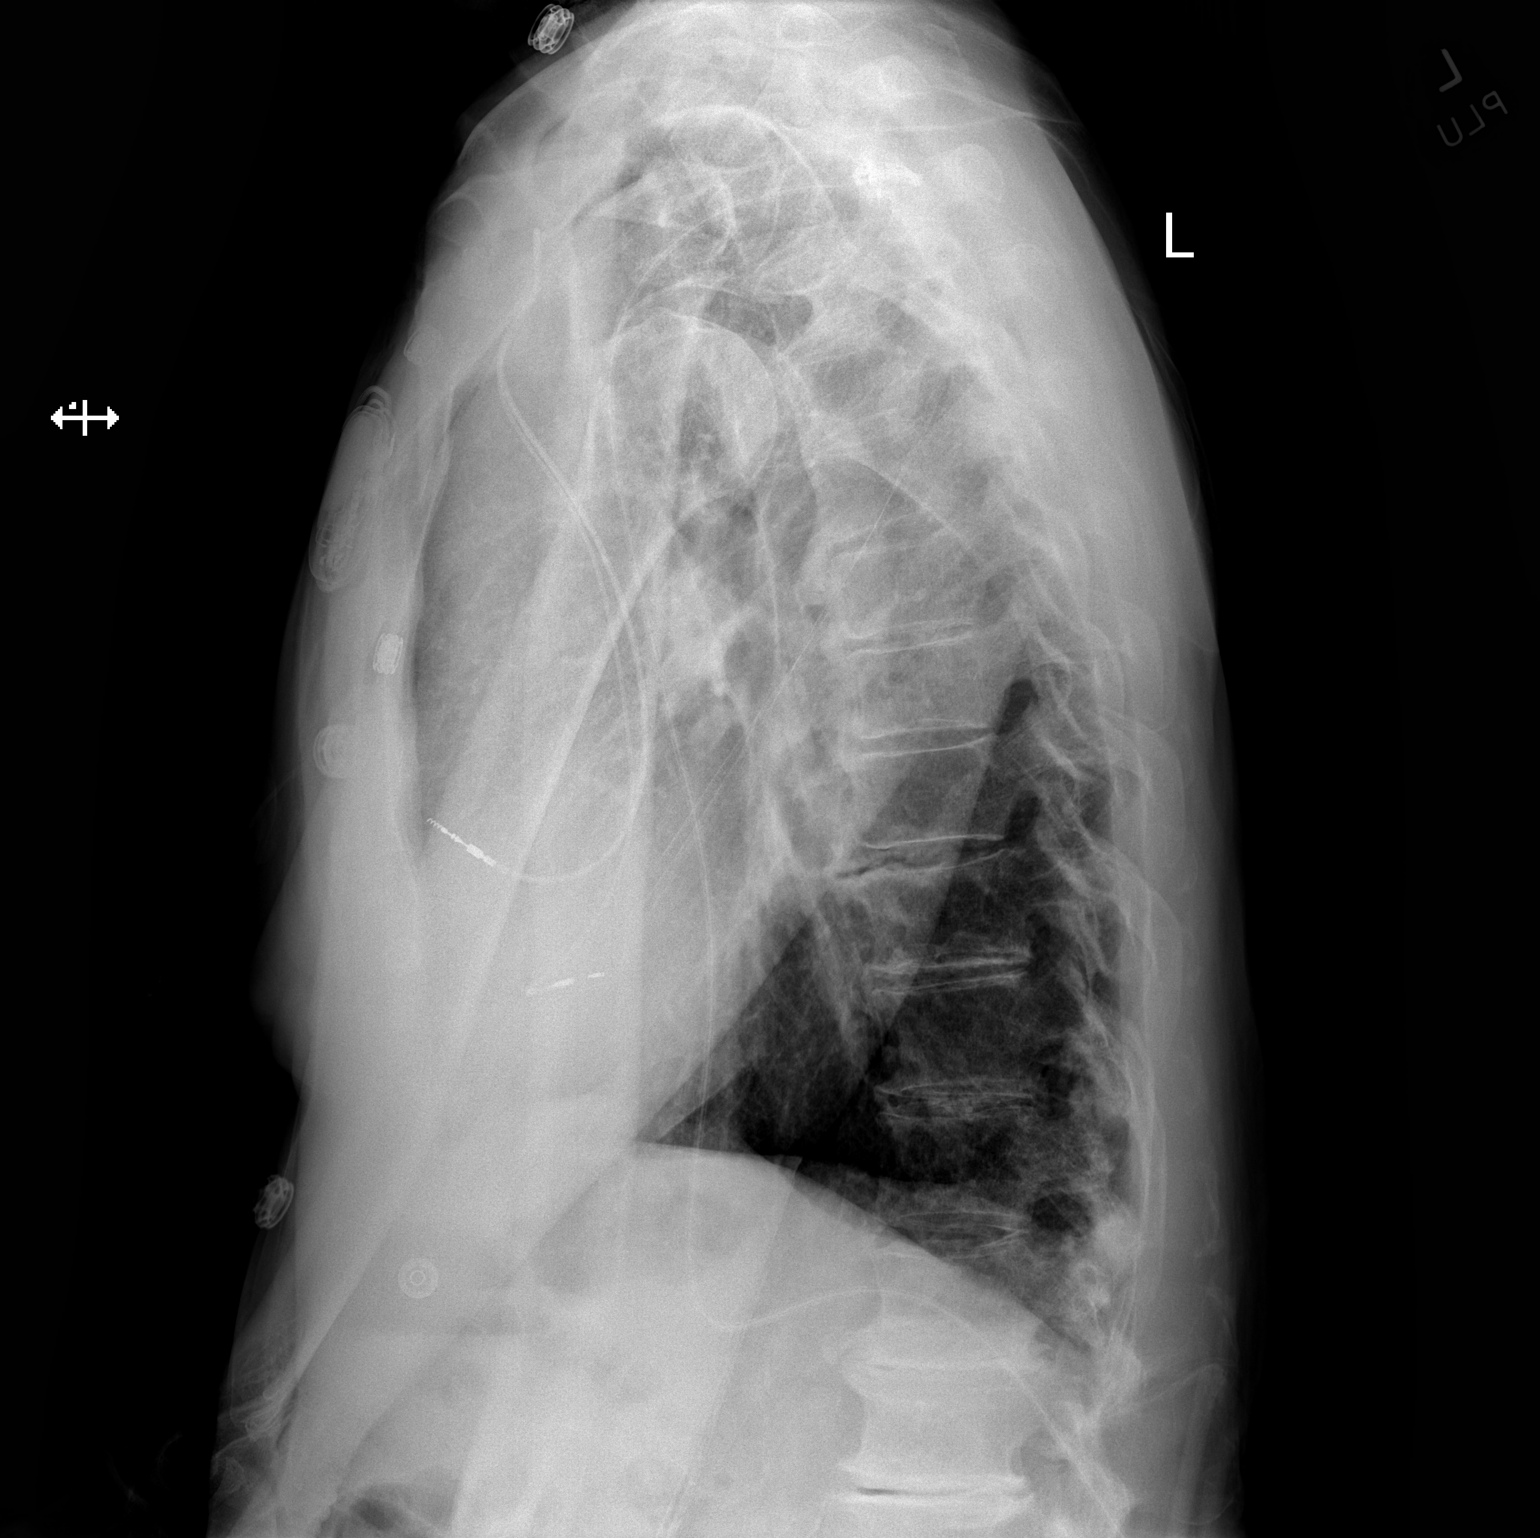

[2 of 2 positions shown; findings below may reference images not displayed]

FINDINGS: Emphysematous changes of the lungs. No focal consolidation, pleural
effusion or pneumothorax. The cardiac silhouette is within limits.
Left pectoral pacemaker device. No acute osseous pathology.
Degenerative changes of the spine.
IMPRESSION: No active cardiopulmonary disease.

## 2021-03-12 ENCOUNTER — Ambulatory Visit (INDEPENDENT_AMBULATORY_CARE_PROVIDER_SITE_OTHER): Payer: Medicare Other

## 2021-03-12 DIAGNOSIS — I441 Atrioventricular block, second degree: Secondary | ICD-10-CM | POA: Diagnosis not present

## 2021-03-12 LAB — CUP PACEART REMOTE DEVICE CHECK
Battery Remaining Longevity: 160 mo
Battery Voltage: 3.19 V
Brady Statistic AP VP Percent: 5.34 %
Brady Statistic AP VS Percent: 0.48 %
Brady Statistic AS VP Percent: 45.31 %
Brady Statistic AS VS Percent: 48.87 %
Brady Statistic RA Percent Paced: 6.43 %
Brady Statistic RV Percent Paced: 50.65 %
Date Time Interrogation Session: 20220705012542
Implantable Lead Implant Date: 20220103
Implantable Lead Implant Date: 20220103
Implantable Lead Location: 753859
Implantable Lead Location: 753860
Implantable Lead Model: 3830
Implantable Lead Model: 5076
Implantable Pulse Generator Implant Date: 20220103
Lead Channel Impedance Value: 361 Ohm
Lead Channel Impedance Value: 361 Ohm
Lead Channel Impedance Value: 475 Ohm
Lead Channel Impedance Value: 513 Ohm
Lead Channel Pacing Threshold Amplitude: 0.625 V
Lead Channel Pacing Threshold Amplitude: 0.875 V
Lead Channel Pacing Threshold Pulse Width: 0.4 ms
Lead Channel Pacing Threshold Pulse Width: 0.4 ms
Lead Channel Sensing Intrinsic Amplitude: 1 mV
Lead Channel Sensing Intrinsic Amplitude: 1 mV
Lead Channel Sensing Intrinsic Amplitude: 21.75 mV
Lead Channel Sensing Intrinsic Amplitude: 21.75 mV
Lead Channel Setting Pacing Amplitude: 1.5 V
Lead Channel Setting Pacing Amplitude: 2 V
Lead Channel Setting Pacing Pulse Width: 0.4 ms
Lead Channel Setting Sensing Sensitivity: 1.2 mV

## 2021-04-02 NOTE — Progress Notes (Signed)
Remote pacemaker transmission.   

## 2021-06-10 ENCOUNTER — Ambulatory Visit (INDEPENDENT_AMBULATORY_CARE_PROVIDER_SITE_OTHER): Payer: Medicare Other

## 2021-06-10 DIAGNOSIS — I441 Atrioventricular block, second degree: Secondary | ICD-10-CM

## 2021-06-10 DIAGNOSIS — Z950 Presence of cardiac pacemaker: Secondary | ICD-10-CM

## 2021-06-10 LAB — CUP PACEART REMOTE DEVICE CHECK
Battery Remaining Longevity: 159 mo
Battery Voltage: 3.16 V
Brady Statistic AP VP Percent: 3.77 %
Brady Statistic AP VS Percent: 0.54 %
Brady Statistic AS VP Percent: 35.79 %
Brady Statistic AS VS Percent: 59.9 %
Brady Statistic RA Percent Paced: 5.23 %
Brady Statistic RV Percent Paced: 39.56 %
Date Time Interrogation Session: 20221002202833
Implantable Lead Implant Date: 20220103
Implantable Lead Implant Date: 20220103
Implantable Lead Location: 753859
Implantable Lead Location: 753860
Implantable Lead Model: 3830
Implantable Lead Model: 5076
Implantable Pulse Generator Implant Date: 20220103
Lead Channel Impedance Value: 361 Ohm
Lead Channel Impedance Value: 361 Ohm
Lead Channel Impedance Value: 456 Ohm
Lead Channel Impedance Value: 494 Ohm
Lead Channel Pacing Threshold Amplitude: 0.375 V
Lead Channel Pacing Threshold Amplitude: 0.75 V
Lead Channel Pacing Threshold Pulse Width: 0.4 ms
Lead Channel Pacing Threshold Pulse Width: 0.4 ms
Lead Channel Sensing Intrinsic Amplitude: 0.75 mV
Lead Channel Sensing Intrinsic Amplitude: 0.75 mV
Lead Channel Sensing Intrinsic Amplitude: 23.5 mV
Lead Channel Sensing Intrinsic Amplitude: 23.5 mV
Lead Channel Setting Pacing Amplitude: 1.5 V
Lead Channel Setting Pacing Amplitude: 2 V
Lead Channel Setting Pacing Pulse Width: 0.4 ms
Lead Channel Setting Sensing Sensitivity: 1.2 mV

## 2021-06-11 ENCOUNTER — Telehealth: Payer: Self-pay | Admitting: Internal Medicine

## 2021-06-11 NOTE — Telephone Encounter (Signed)
Returned call to Dr. Sonny Dandy.  Advised CT scans were fine with his device.  He should call if he ever needs an MRI.  Pt understands.

## 2021-06-11 NOTE — Telephone Encounter (Signed)
Patient wanted to know if he can get a CT scan. He is concerned about if his internal device would prohibit him from getting it done or not. He has an appointment with Dr. Felipa Eth on Friday 06-14-21 and would like to know before his appointment. Please advise

## 2021-06-14 ENCOUNTER — Ambulatory Visit
Admission: RE | Admit: 2021-06-14 | Discharge: 2021-06-14 | Disposition: A | Discharge status: Home / Self Care | Payer: Medicare Other | Source: Ambulatory Visit | Attending: Geriatric Medicine | Admitting: Geriatric Medicine

## 2021-06-14 ENCOUNTER — Other Ambulatory Visit: Payer: Self-pay | Admitting: Geriatric Medicine

## 2021-06-14 DIAGNOSIS — R058 Other specified cough: Secondary | ICD-10-CM | POA: Diagnosis not present

## 2021-06-14 DIAGNOSIS — R059 Cough, unspecified: Secondary | ICD-10-CM | POA: Diagnosis not present

## 2021-06-14 DIAGNOSIS — Z23 Encounter for immunization: Secondary | ICD-10-CM | POA: Diagnosis not present

## 2021-06-14 DIAGNOSIS — R0600 Dyspnea, unspecified: Secondary | ICD-10-CM | POA: Diagnosis not present

## 2021-06-17 ENCOUNTER — Other Ambulatory Visit: Payer: Self-pay | Admitting: Geriatric Medicine

## 2021-06-17 DIAGNOSIS — R918 Other nonspecific abnormal finding of lung field: Secondary | ICD-10-CM

## 2021-06-18 NOTE — Progress Notes (Signed)
Remote pacemaker transmission.   

## 2021-06-21 ENCOUNTER — Ambulatory Visit
Admission: RE | Admit: 2021-06-21 | Discharge: 2021-06-21 | Disposition: A | Discharge status: Home / Self Care | Payer: Medicare Other | Source: Ambulatory Visit | Attending: Geriatric Medicine | Admitting: Geriatric Medicine

## 2021-06-21 ENCOUNTER — Other Ambulatory Visit: Payer: Self-pay

## 2021-06-21 ENCOUNTER — Other Ambulatory Visit: Payer: Self-pay | Admitting: Geriatric Medicine

## 2021-06-21 DIAGNOSIS — J929 Pleural plaque without asbestos: Secondary | ICD-10-CM | POA: Diagnosis not present

## 2021-06-21 DIAGNOSIS — I700 Atherosclerosis of aorta: Secondary | ICD-10-CM | POA: Diagnosis not present

## 2021-06-21 DIAGNOSIS — I2510 Atherosclerotic heart disease of native coronary artery without angina pectoris: Secondary | ICD-10-CM | POA: Diagnosis not present

## 2021-06-21 DIAGNOSIS — J852 Abscess of lung without pneumonia: Secondary | ICD-10-CM

## 2021-06-21 DIAGNOSIS — J984 Other disorders of lung: Secondary | ICD-10-CM | POA: Diagnosis not present

## 2021-06-21 DIAGNOSIS — R918 Other nonspecific abnormal finding of lung field: Secondary | ICD-10-CM

## 2021-06-21 MED ORDER — IOPAMIDOL (ISOVUE-300) INJECTION 61%
75.0000 mL | Freq: Once | INTRAVENOUS | Status: AC | PRN
  Administered 2021-06-21: 75 mL via INTRAVENOUS

## 2021-07-10 DIAGNOSIS — Z23 Encounter for immunization: Secondary | ICD-10-CM | POA: Diagnosis not present

## 2021-07-26 ENCOUNTER — Ambulatory Visit
Admission: RE | Admit: 2021-07-26 | Discharge: 2021-07-26 | Disposition: A | Discharge status: Home / Self Care | Payer: Medicare Other | Source: Ambulatory Visit | Attending: Geriatric Medicine | Admitting: Geriatric Medicine

## 2021-07-26 DIAGNOSIS — J984 Other disorders of lung: Secondary | ICD-10-CM | POA: Diagnosis not present

## 2021-07-26 DIAGNOSIS — I2510 Atherosclerotic heart disease of native coronary artery without angina pectoris: Secondary | ICD-10-CM | POA: Diagnosis not present

## 2021-07-26 DIAGNOSIS — I700 Atherosclerosis of aorta: Secondary | ICD-10-CM | POA: Diagnosis not present

## 2021-07-26 DIAGNOSIS — J852 Abscess of lung without pneumonia: Secondary | ICD-10-CM

## 2021-07-26 MED ORDER — IOPAMIDOL (ISOVUE-300) INJECTION 61%
75.0000 mL | Freq: Once | INTRAVENOUS | Status: AC | PRN
  Administered 2021-07-26: 75 mL via INTRAVENOUS

## 2021-07-29 ENCOUNTER — Other Ambulatory Visit: Payer: Self-pay | Admitting: Geriatric Medicine

## 2021-07-29 DIAGNOSIS — R918 Other nonspecific abnormal finding of lung field: Secondary | ICD-10-CM | POA: Diagnosis not present

## 2021-07-29 DIAGNOSIS — I700 Atherosclerosis of aorta: Secondary | ICD-10-CM | POA: Diagnosis not present

## 2021-07-29 DIAGNOSIS — D6869 Other thrombophilia: Secondary | ICD-10-CM | POA: Diagnosis not present

## 2021-07-29 DIAGNOSIS — Z0000 Encounter for general adult medical examination without abnormal findings: Secondary | ICD-10-CM | POA: Diagnosis not present

## 2021-07-29 DIAGNOSIS — I480 Paroxysmal atrial fibrillation: Secondary | ICD-10-CM | POA: Diagnosis not present

## 2021-07-29 DIAGNOSIS — Z79899 Other long term (current) drug therapy: Secondary | ICD-10-CM | POA: Diagnosis not present

## 2021-07-29 DIAGNOSIS — Z950 Presence of cardiac pacemaker: Secondary | ICD-10-CM | POA: Diagnosis not present

## 2021-07-29 DIAGNOSIS — I495 Sick sinus syndrome: Secondary | ICD-10-CM | POA: Diagnosis not present

## 2021-07-29 DIAGNOSIS — Z1331 Encounter for screening for depression: Secondary | ICD-10-CM | POA: Diagnosis not present

## 2021-07-29 DIAGNOSIS — E7800 Pure hypercholesterolemia, unspecified: Secondary | ICD-10-CM | POA: Diagnosis not present

## 2021-08-28 ENCOUNTER — Telehealth: Payer: Self-pay | Admitting: Internal Medicine

## 2021-08-28 NOTE — Telephone Encounter (Signed)
Returned call to Dr. Sonny Dandy.  Per Dr. Legrand Rams appt with Dr. Sonny Dandy to discuss.  Pt called and scheduled to see Dr. Lovena Le September 17, 2021

## 2021-08-28 NOTE — Telephone Encounter (Signed)
Called patient, patient wants to talk to Dr. Lovena Le about an ablation. Will send message to Dr. Lovena Le and his nurse.

## 2021-08-28 NOTE — Telephone Encounter (Signed)
Daniel Payne is calling back wanting to know if he could speak with Dr. Acie Fredrickson as he would probably need to run this by him first... please advise

## 2021-08-28 NOTE — Telephone Encounter (Signed)
Bentzion is calling requesting to speak with someone in regards to possibly scheduling an ablation.

## 2021-09-06 ENCOUNTER — Telehealth: Payer: Self-pay | Admitting: Cardiovascular Disease

## 2021-09-06 ENCOUNTER — Telehealth: Payer: Self-pay

## 2021-09-06 NOTE — Telephone Encounter (Signed)
Returned call to Dr. Sonny Dandy.  He had 3 questions-did not need an appointment.  Has an appointment on January 10 with Dr. Lovena Le.  Pt had a brief episode of afib about 10 days ago.  At that time he restarted his Eliquis.  Questions   How long does one have to be in afib to develop a clot? Is it ok to stop Eliquis? Pt has increased afib breakthrough in high altitiude.  Ok to increase diltiazem at high altitudes?  Spoke with Dr. Lovena Le.  Answers to questions:  Data is unclear.  Some people think 2 days, some say 1 hour.    Not ok to stop Eliquis.  Pt should stop skiiing. Ok to increase diltiazem in high altitudes -Diltiazem 240 mg in AM and 120 mg in PM  Pt also wants to send a remote to see if his afib episode was the only episode noted. Will send instruction via mychart with directions to send a remote check.

## 2021-09-06 NOTE — Telephone Encounter (Signed)
Informed patient that his overall burden of AF since 09/01/21 was <0.1%, patient appreciative of call back

## 2021-09-06 NOTE — Telephone Encounter (Signed)
Patient requests nurse call him back. He would like to have an appointment with Dr. Acie Fredrickson when he comes back into the office. I offered his first available 10/08/21, but he states this is too late and would like a nurse to call him back.

## 2021-09-11 ENCOUNTER — Telehealth: Payer: Self-pay

## 2021-09-11 MED ORDER — DILTIAZEM HCL ER 180 MG PO CP24: 180.0000 mg | ORAL_CAPSULE | Freq: Every morning | ORAL | 3 refills | Status: DC

## 2021-09-11 MED ORDER — DILTIAZEM HCL ER 120 MG PO CP24: 120.0000 mg | ORAL_CAPSULE | Freq: Every evening | ORAL | 3 refills | Status: DC

## 2021-09-11 NOTE — Telephone Encounter (Signed)
Pt called in to speak with Dr. Acie Fredrickson in regards to medication management.  Pt is requesting to increase dose of diltiazem to 180 mg PO Qam and continue 120 mg PO Qpm.  Pt also reports having breakthrough AF.  Pt would like Dr. Acie Fredrickson to call cell [phone].

## 2021-09-11 NOTE — Telephone Encounter (Signed)
Called pt notified of MD approval of increase in diltiazem to 180 q am and continue 120 mg PO q evening. Orders placed pt had no questions or concerns.

## 2021-09-17 ENCOUNTER — Other Ambulatory Visit: Payer: Self-pay

## 2021-09-17 ENCOUNTER — Encounter: Payer: Self-pay | Admitting: Internal Medicine

## 2021-09-17 ENCOUNTER — Ambulatory Visit (INDEPENDENT_AMBULATORY_CARE_PROVIDER_SITE_OTHER): Payer: Medicare Other

## 2021-09-17 ENCOUNTER — Ambulatory Visit (INDEPENDENT_AMBULATORY_CARE_PROVIDER_SITE_OTHER): Payer: Medicare Other | Admitting: Internal Medicine

## 2021-09-17 VITALS — BP 112/66 | HR 68 | Ht 69.5 in | Wt 155.0 lb

## 2021-09-17 DIAGNOSIS — I441 Atrioventricular block, second degree: Secondary | ICD-10-CM

## 2021-09-17 DIAGNOSIS — I480 Paroxysmal atrial fibrillation: Secondary | ICD-10-CM

## 2021-09-17 DIAGNOSIS — Z950 Presence of cardiac pacemaker: Secondary | ICD-10-CM | POA: Diagnosis not present

## 2021-09-17 LAB — CUP PACEART REMOTE DEVICE CHECK
Battery Remaining Longevity: 155 mo
Battery Voltage: 3.1 V
Brady Statistic AP VP Percent: 3.29 %
Brady Statistic AP VS Percent: 0.31 %
Brady Statistic AS VP Percent: 53.89 %
Brady Statistic AS VS Percent: 42.51 %
Brady Statistic RA Percent Paced: 3.81 %
Brady Statistic RV Percent Paced: 54.92 %
Date Time Interrogation Session: 20230110061959
Implantable Lead Implant Date: 20220103
Implantable Lead Implant Date: 20220103
Implantable Lead Location: 753859
Implantable Lead Location: 753860
Implantable Lead Model: 3830
Implantable Lead Model: 5076
Implantable Pulse Generator Implant Date: 20220103
Lead Channel Impedance Value: 361 Ohm
Lead Channel Impedance Value: 361 Ohm
Lead Channel Impedance Value: 437 Ohm
Lead Channel Impedance Value: 494 Ohm
Lead Channel Pacing Threshold Amplitude: 0.5 V
Lead Channel Pacing Threshold Amplitude: 0.875 V
Lead Channel Pacing Threshold Pulse Width: 0.4 ms
Lead Channel Pacing Threshold Pulse Width: 0.4 ms
Lead Channel Sensing Intrinsic Amplitude: 1 mV
Lead Channel Sensing Intrinsic Amplitude: 1 mV
Lead Channel Sensing Intrinsic Amplitude: 22.5 mV
Lead Channel Sensing Intrinsic Amplitude: 22.5 mV
Lead Channel Setting Pacing Amplitude: 1.5 V
Lead Channel Setting Pacing Amplitude: 2 V
Lead Channel Setting Pacing Pulse Width: 0.4 ms
Lead Channel Setting Sensing Sensitivity: 1.2 mV

## 2021-09-17 NOTE — Patient Instructions (Signed)
Medication Instructions:  Your physician recommends that you continue on your current medications as directed. Please refer to the Current Medication list given to you today.  Labwork: None ordered.  Testing/Procedures: None ordered.  Follow-Up: Your physician wants you to follow-up in: one year with Cristopher Peru, MD or one of the following Advanced Practice Providers on your designated Care Team:   Tommye Standard, [place] Legrand Como "Jonni Sanger" Chalmers Cater, [place]  Remote monitoring is used to monitor your Pacemaker from home. This monitoring reduces the number of office visits required to check your device to one time per year. It allows [place] to keep an eye on the functioning of your device to ensure it is working properly. You are scheduled for a device check from home on 12/17/2021. You may send your transmission at any time that day. If you have a wireless device, the transmission will be sent automatically. After your physician reviews your transmission, you will receive a postcard with your next transmission date.  Any Other Special Instructions Will Be Listed Below (If Applicable).  If you need a refill on your cardiac medications before your next appointment, please call your pharmacy.

## 2021-09-17 NOTE — Progress Notes (Signed)
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 He is a very active 76 yo man with a h/o symptomatic tach-brady syndrome. 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 He has been fairly aware of his episodes of atrial fib in the past. He has mostly had problems with atrial fib when he is skiing at elevation. His PM does show a single episode of 4 hours of atrial fib in October when he was not skiing. He denies chest pain or sob.  Allergies  Allergen Reactions   Concerta [Methylphenidate] Other (See Comments)    Burning mouth   Lialda [Mesalamine] Diarrhea     Current Outpatient Medications  Medication Sig Dispense Refill   acetaminophen (TYLENOL) 500 MG tablet Take 500 mg by mouth at bedtime.      apixaban (ELIQUIS) 5 MG TABS tablet Take 5 mg by mouth 2 (two) times daily.     atorvastatin (LIPITOR) 10 MG tablet Take 10 mg by mouth daily.  11   diltiazem (DILT-XR) 120 MG 24 hr capsule Take 1 capsule (120 mg total) by mouth every evening. 90 capsule 3   diltiazem (DILT-XR) 180 MG 24 hr capsule Take 1 capsule (180 mg total) by mouth every morning. 90 capsule 3   diphenhydrAMINE (BENADRYL) 25 MG tablet Take 25 mg by mouth at bedtime.      No current facility-administered medications for this visit.     Past Medical History:  Diagnosis Date   A-fib [facility])    ADHD (attention deficit hyperactivity disorder)    Cancer [facility])    prostate dr. Chriss Czar davis   Chronic kidney disease    borderline stage III   Fissure, anal    Giardia    working in [place]   Hyperlipidemia    Infectious mononucleosis    Low back pain    Presbycusis of left ear    Spinal stenosis of lumbar region    Ulcerative colitis ([place])     ROS:   All systems reviewed and negative except as noted in the HPI.   Past Surgical History:  Procedure Laterality Date   ANAL FISSURE REPAIR     APPENDECTOMY     PACEMAKER IMPLANT N/A 09/10/2020   Procedure: PACEMAKER IMPLANT;   Surgeon: Evans Lance, MD;  Location: [place] [facility];  Service: Cardiovascular;  Laterality: N/A;   PROSTATE SURGERY     ROTATOR CUFF REPAIR Left    VARICOCELE EXCISION Right      Family History  Problem Relation Age of Onset   Stroke Mother 37   Heart attack Father      Social History   Socioeconomic History   Marital status: Married    Spouse name: Not on file   Number of children: Not on file   Years of education: Not on file   Highest education level: Not on file  Occupational History   Not on file  Tobacco Use   Smoking status: Never   Smokeless tobacco: Never  Vaping Use   Vaping Use: Never used  Substance and Sexual Activity   Alcohol use: Yes    Alcohol/week: 0.0 standard drinks    Comment: 1-2 glasses of red wine   Drug use: No   Sexual activity: Not on file  Other Topics Concern   Not on file  Social History Narrative   Not on file   Social Determinants of Health   Financial Resource Strain:  Not on file  Food Insecurity: Not on file  Transportation Needs: Not on file  Physical Activity: Not on file  Stress: Not on file  Social Connections: Not on file  Intimate Partner Violence: Not on file     BP 112/66    Pulse 68    Ht 5' 9.5" (1.765 m)    Wt 155 lb (70.3 kg)    SpO2 99%    BMI 22.56 kg/m   Physical Exam:  Well appearing NAD HEENT: Unremarkable Neck:  No JVD, no thyromegally Lymphatics:  No adenopathy Back:  No CVA tenderness Lungs:  Clear with no wheezes HEART:  Regular rate rhythm, no murmurs, no rubs, no clicks Abd:  soft, positive bowel sounds, no organomegally, no rebound, no guarding Ext:  2 plus pulses, no edema, no cyanosis, no clubbing Skin:  No rashes no nodules Neuro:  CN II through XII intact, motor grossly intact  EKG - nsr with rbbb  DEVICE  Normal device function.  See PaceArt for details.   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 PPM -his medtronic DDD PM is working  normally. We will recheck in several months. Coags - we discussed the rationale for systemic anti-coagulation as well as possible options for not using. I have recommended he continue for now. It is unclear if/when he will have more atrial fib but it is very likely. I pointed out that he did not feel the 4 hour episode we have documented in October. For this reason I have recommended he stay on Eliquis unless he is going skiing and this should probably the last year he ski's. I do not think that wading in trout streams is of sufficient risk not to take anti-coagulation.  PAF - he will likely have more. We discussed the indications for catheter ablation and he currently does not have an indication for ablation or AA drug therapy.  Carleene Overlie Siddalee Vanderheiden,MD

## 2021-09-19 ENCOUNTER — Ambulatory Visit
Admission: RE | Admit: 2021-09-19 | Discharge: 2021-09-19 | Disposition: A | Discharge status: Home / Self Care | Payer: Medicare Other | Source: Ambulatory Visit | Attending: Geriatric Medicine | Admitting: Geriatric Medicine

## 2021-09-19 DIAGNOSIS — R918 Other nonspecific abnormal finding of lung field: Secondary | ICD-10-CM

## 2021-09-19 DIAGNOSIS — J852 Abscess of lung without pneumonia: Secondary | ICD-10-CM | POA: Diagnosis not present

## 2021-09-19 DIAGNOSIS — I700 Atherosclerosis of aorta: Secondary | ICD-10-CM | POA: Diagnosis not present

## 2021-09-19 DIAGNOSIS — J479 Bronchiectasis, uncomplicated: Secondary | ICD-10-CM | POA: Diagnosis not present

## 2021-09-19 DIAGNOSIS — R911 Solitary pulmonary nodule: Secondary | ICD-10-CM | POA: Diagnosis not present

## 2021-09-20 ENCOUNTER — Other Ambulatory Visit: Payer: Medicare Other

## 2021-09-26 NOTE — Progress Notes (Signed)
Remote pacemaker transmission.   

## 2021-11-29 IMAGING — CR DG CHEST 2V
2 series · 2 of 2 positions shown · non-contrast
Comparison: 09/10/2020 chest radiograph.

CLINICAL DATA: Nonproductive cough and dyspnea for 1 month

EXAM:
CHEST - 2 VIEW

[w chest pa]
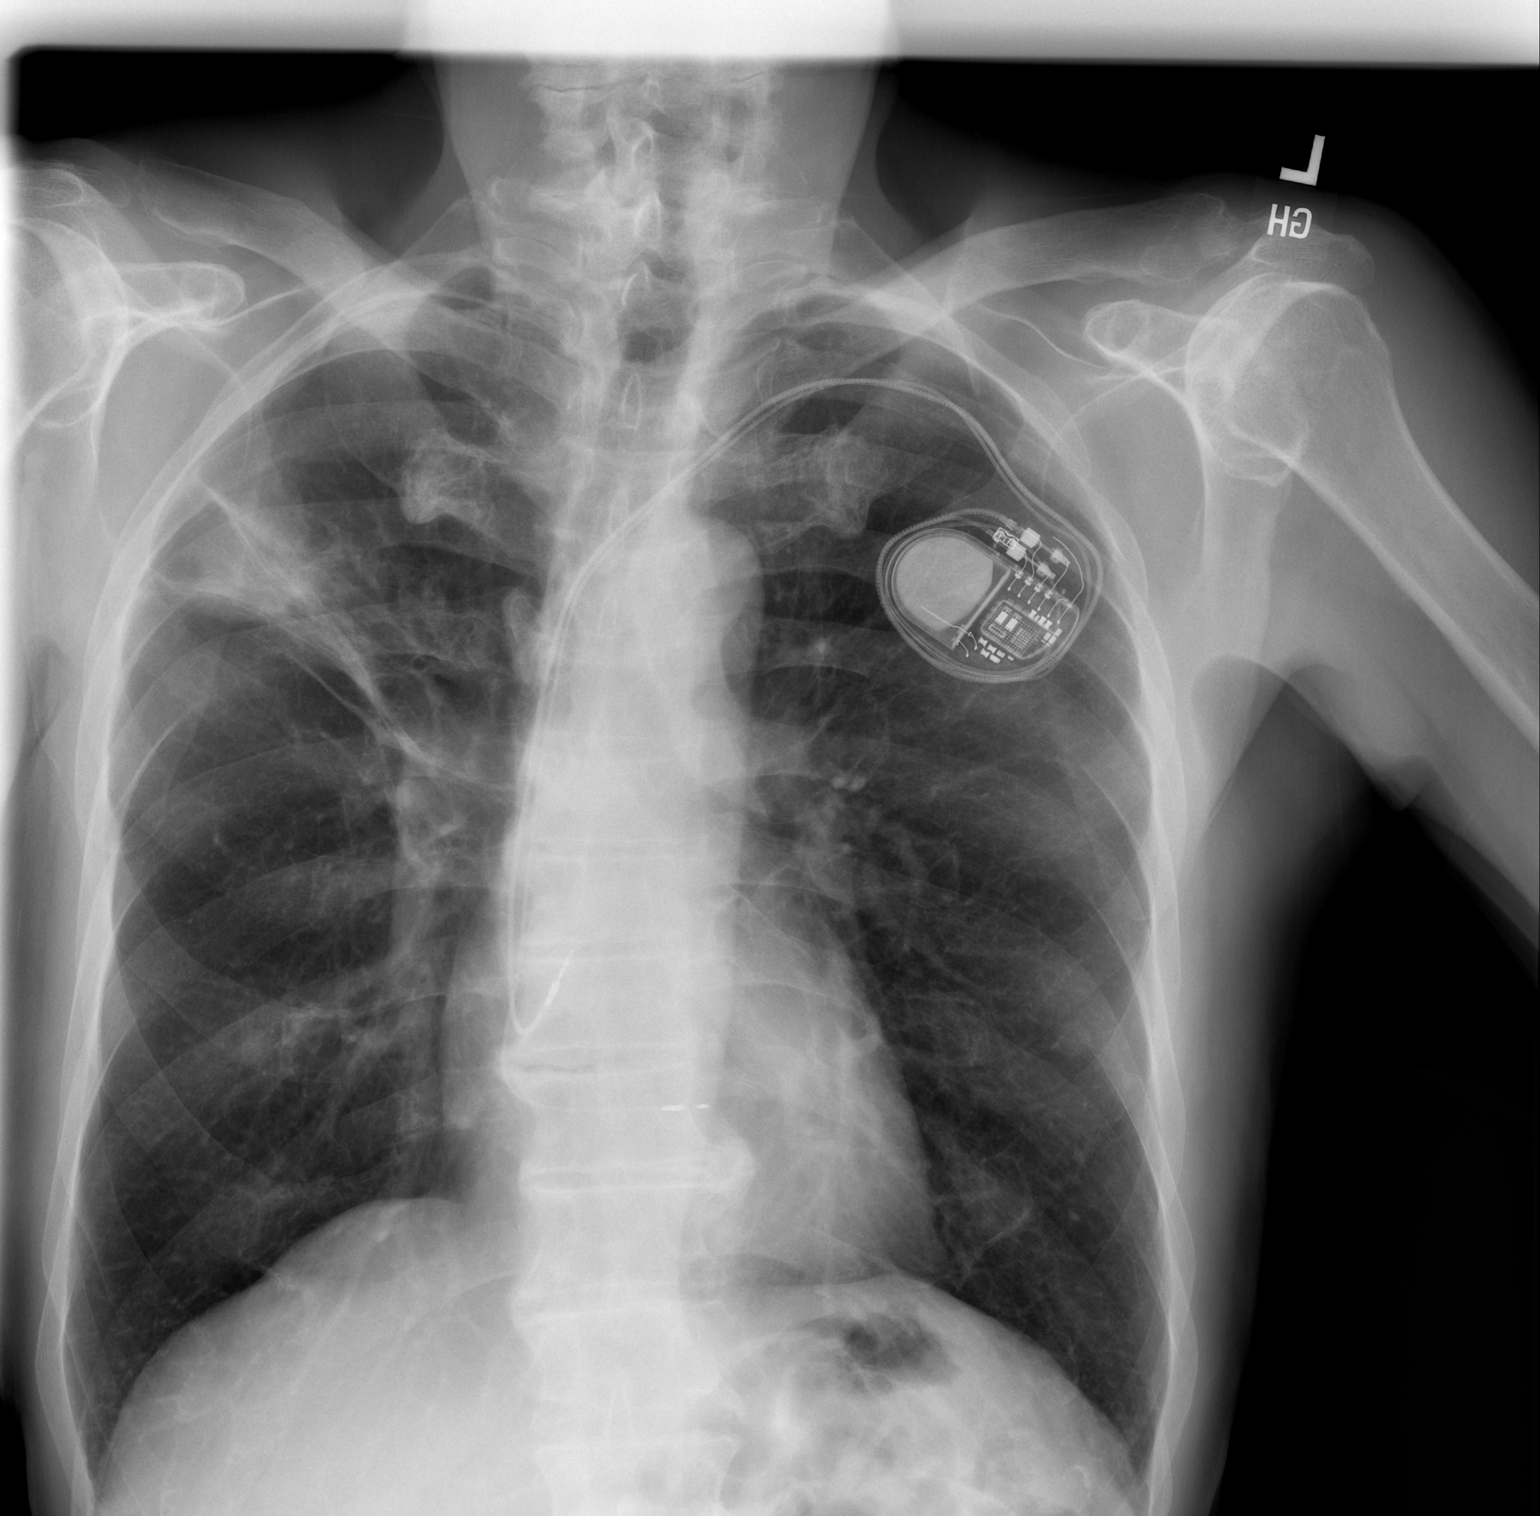

[w chest lat]
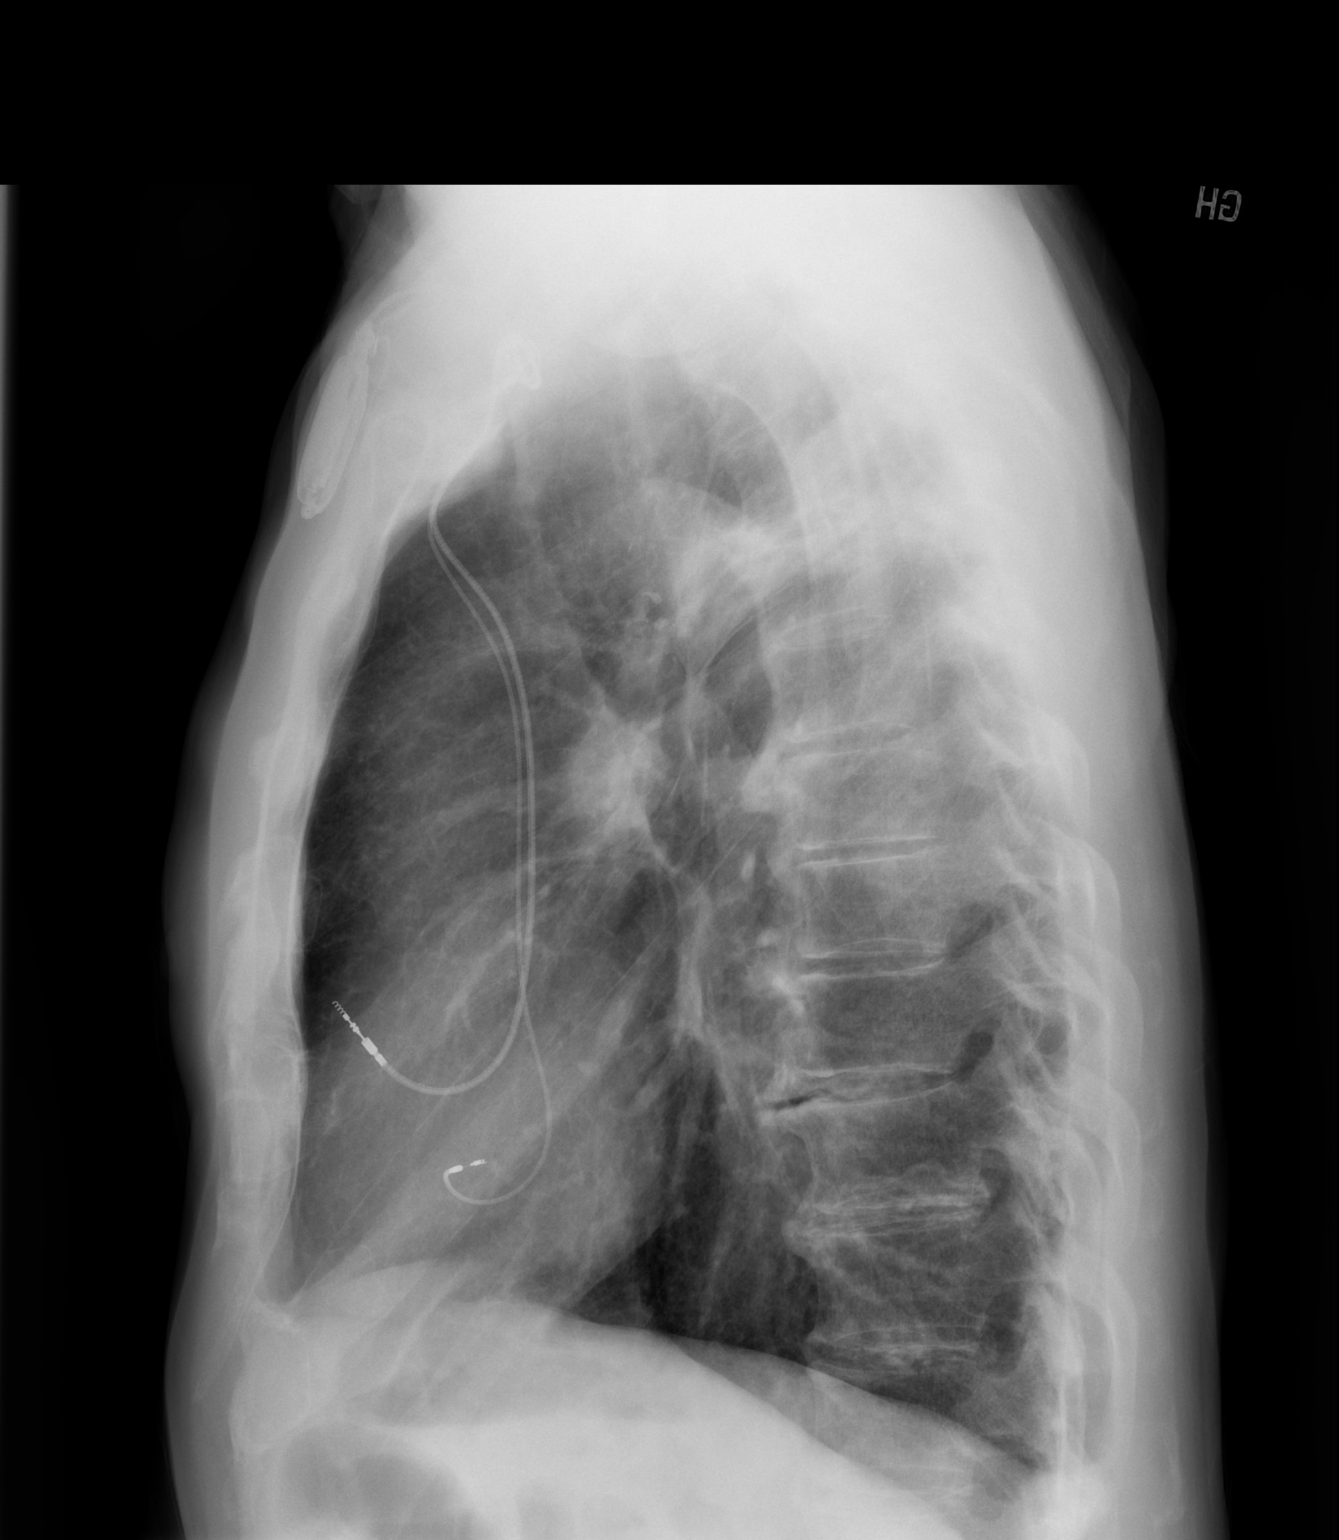

[2 of 2 positions shown; findings below may reference images not displayed]

FINDINGS: Stable configuration of 2 lead left subclavian pacemaker. Stable
cardiomediastinal silhouette with normal heart size. No
pneumothorax. No pleural effusion. Focal patchy consolidation and
streaky linear opacities in posterior upper right lung, new. Clear
left lung.
IMPRESSION: Focal patchy consolidation and streaky linear opacities in the
posterior upper right lung, new, which could represent a pneumonia
in the correct clinical setting. Short-term follow-up post treatment
chest radiographs recommended to document resolution. If the patient
does not have a clinical presentation compatible with pneumonia,
chest CT with IV contrast would be recommended for further
evaluation at this time.

These results will be called to the ordering clinician or
representative by the Radiologist Assistant, and communication
documented in the PACS or [REDACTED].

## 2021-12-06 IMAGING — CT CT CHEST W/ CM
2 of 4 series · 11 of 36 positions shown, 13 images · IV contrast (iopamidol)
Comparison: No prior chest CT. Chest x-ray 06/14/2021. Chest x-ray
09/10/2020

CLINICAL DATA: 75-year-old male with history of persistent cough
following COVID infection in April 2021. Abnormal chest x-ray.

EXAM:
CT CHEST WITH CONTRAST
TECHNIQUE: Multidetector CT imaging of the chest was performed during
intravenous contrast administration.
CONTRAST:  75mL 0UWY3H-0MM IOPAMIDOL (0UWY3H-0MM) INJECTION 61%

[Series 2: chest 2.00 br40 s3 · axial · 0.55mm/px · z∈[+1360,+1686]mm · 8 of 193 slices shown, 10 images (1 of 2)]
[im 15/193  mediastinal]
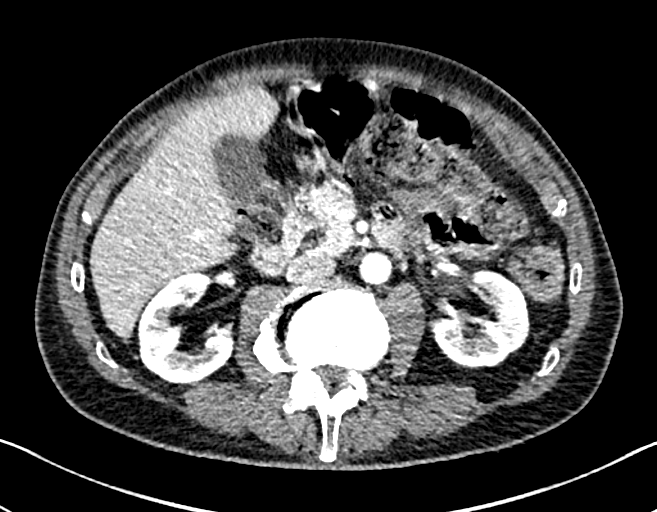
[im 15/193  lung]
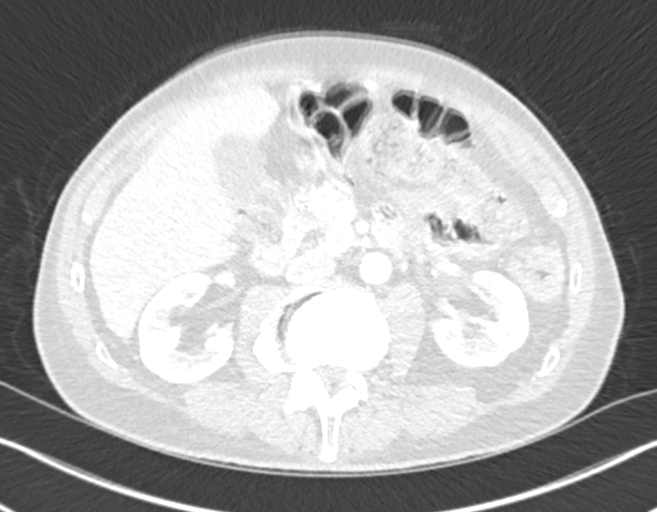
[im 45/193  lung]
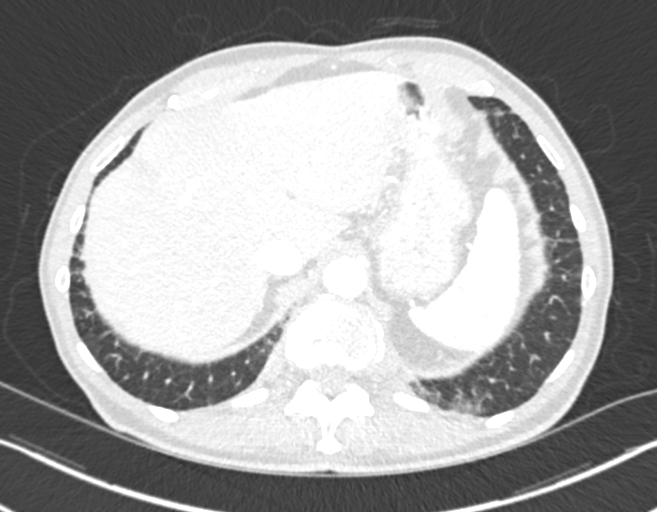
[im 60/193  lung]
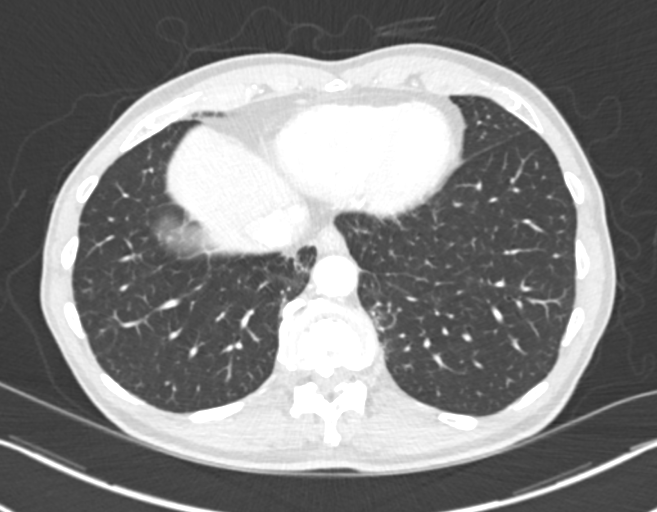
[im 89/193  lung]
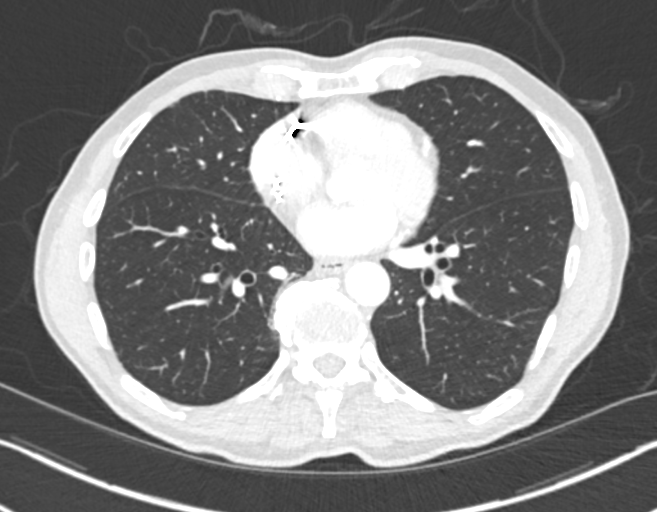
[im 104/193  mediastinal]
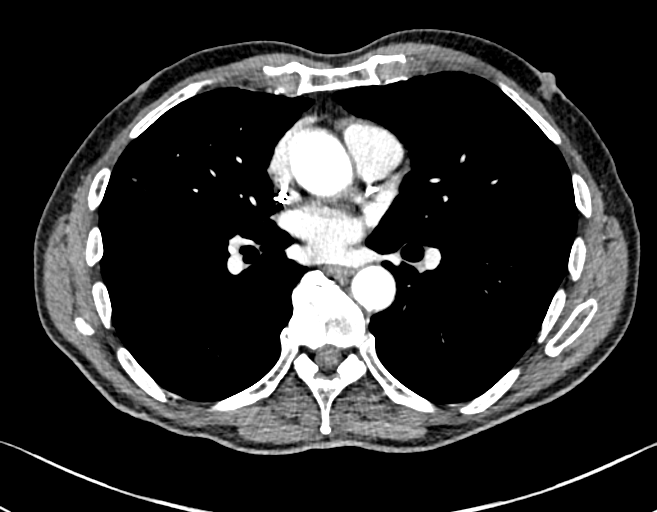
[im 104/193  lung]
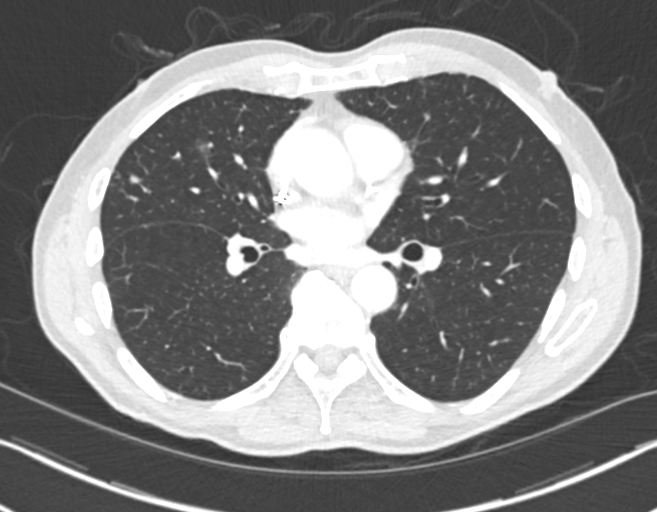
[im 133/193  lung]
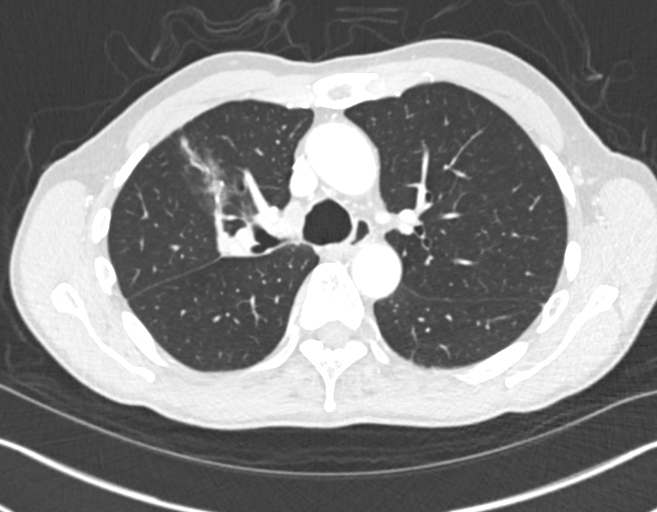
[im 148/193  lung]
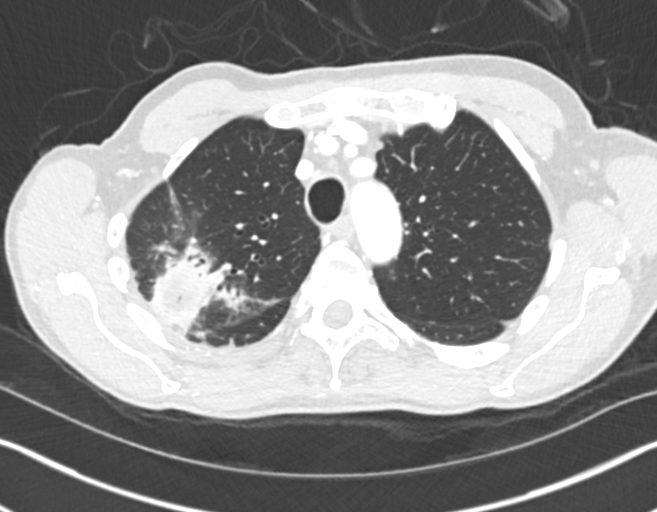
[im 178/193  lung]
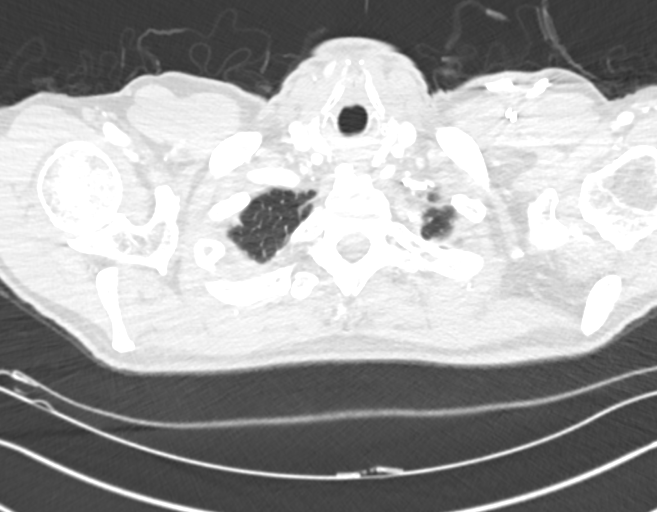

[Series 4: chest 2.00 br40 s3 · coronal · 0.71mm/px · 3 of 140 slices shown (2 of 2)]
[im 28/140  lung]
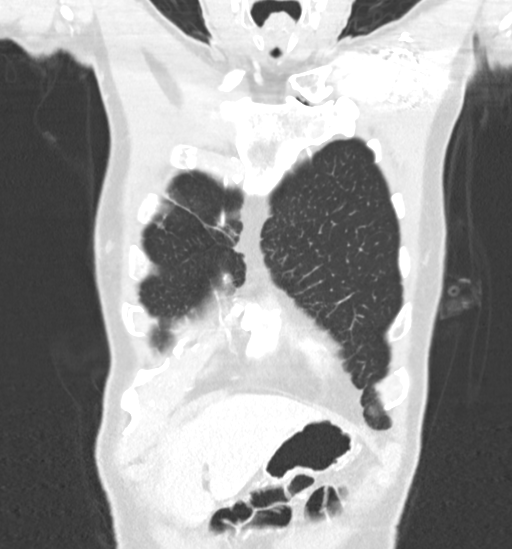
[im 56/140  lung]
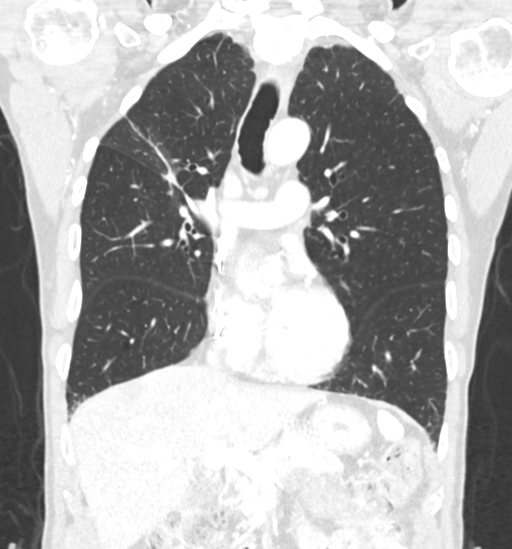
[im 84/140  lung]
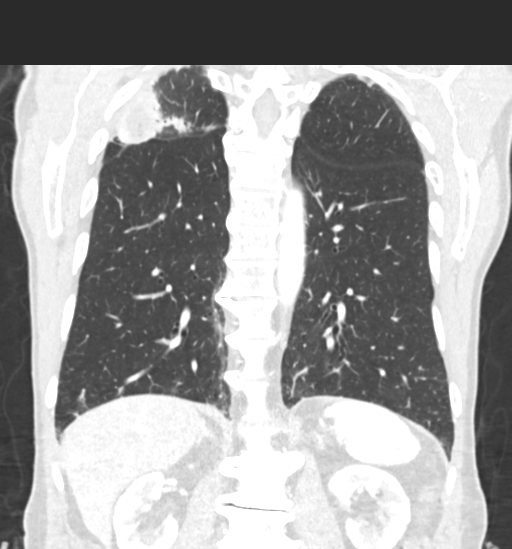

[11 of 36 positions shown; findings below may reference images not displayed]

FINDINGS: Cardiovascular: Heart size is normal. There is no significant
pericardial fluid, thickening or pericardial calcification. There is
aortic atherosclerosis, as well as atherosclerosis of the great
vessels of the mediastinum and the coronary arteries, including
calcified atherosclerotic plaque in the left main, left anterior
descending, left circumflex and right coronary arteries. Left-sided
pacemaker device in place with lead tips terminating in the right
atrial appendage and interventricular septum in the proximal right
ventricle.

Mediastinum/Nodes: No pathologically enlarged mediastinal or hilar
lymph nodes. There are several prominent borderline enlarged right
hilar lymph nodes measuring up to 1 cm in short axis (axial image 65
of series 2). Esophagus is unremarkable in appearance. No axillary
lymphadenopathy.

Lungs/Pleura: In the periphery of the right upper lobe (axial image
44 of series 8 and coronal image 82 of series 4) there is a
mass-like area measuring 4.8 x 3.6 x 4.5 cm which demonstrates some
central necrosis measuring approximately 2 cm in diameter (axial
image 44 of series 2). This lesion contacts the pleura and has
ill-defined margins with surrounding architectural distortion and
adjacent areas of ground-glass attenuation. In the periphery of the
lesion the appearance is most suggestive of airspace consolidation,
as there are several air bronchograms. The lesion also contacts the
superior aspect of the right major fissure without definitive
extension across the fissure. A few other scattered patchy areas of
mild ground-glass attenuation are noted elsewhere in the lungs. Mild
interlobular septal thickening is noted in the extreme lung bases. 5
mm solid appearing nodule in the left lower lobe (axial image 113 of
series 8). No other definite suspicious appearing pulmonary nodules
are noted.

Upper Abdomen: Aortic atherosclerosis.

Musculoskeletal: There are no aggressive appearing lytic or blastic
lesions noted in the visualized portions of the skeleton.
IMPRESSION: 1. Large centrally cavitary mass-like area in the periphery of the
right upper lobe, as above. Given the overall appearance of this
lesion, the recent history of COVID infection, and the absence of a
radiographically perceivable abnormality in this region on prior
chest x-ray from 09/10/2020, this is favored to be of infectious
etiology (i.e., necrotizing pneumonia). The prominent borderline
enlarged right hilar lymph nodes are favored to be reactive in the
setting of acute infection. Neoplasm is not excluded. However, the
most likely cavitating primary pulmonary neoplasm would be squamous
cell carcinoma, which is typically more centrally located in the
lung. Short-term repeat contrast enhanced chest CT is recommended in
1 month following trial of antimicrobial therapy. PET-CT is not
recommended at this time due to the high potential for a
false-positive examination.
2. 5 mm left lower lobe pulmonary nodule, nonspecific. Attention at
time of repeat chest CT is recommended to ensure stability.
3. Subtle areas of interlobular septal thickening evident in the
lung bases. This is nonspecific, but could be indicative of early
interstitial lung disease. Attention at time of repeat chest CT is
recommended.
4. Aortic atherosclerosis, in addition to left main and 3 vessel
coronary artery disease. Please note that although the presence of
coronary artery calcium documents the presence of coronary artery
disease, the severity of this disease and any potential stenosis
cannot be assessed on this non-gated CT examination. Assessment for
potential risk factor modification, dietary therapy or pharmacologic
therapy may be warranted, if clinically indicated.

These results were called by telephone at the time of interpretation
on 06/21/2021 at [DATE] to provider THOBANE NSIBANDE , who verbally
acknowledged these results.

Aortic Atherosclerosis ([id number]-T4Y.Y).

## 2021-12-10 DIAGNOSIS — H1045 Other chronic allergic conjunctivitis: Secondary | ICD-10-CM | POA: Diagnosis not present

## 2021-12-10 DIAGNOSIS — H2513 Age-related nuclear cataract, bilateral: Secondary | ICD-10-CM | POA: Diagnosis not present

## 2021-12-10 DIAGNOSIS — H33322 Round hole, left eye: Secondary | ICD-10-CM | POA: Diagnosis not present

## 2021-12-10 DIAGNOSIS — H43813 Vitreous degeneration, bilateral: Secondary | ICD-10-CM | POA: Diagnosis not present

## 2021-12-10 DIAGNOSIS — H11823 Conjunctivochalasis, bilateral: Secondary | ICD-10-CM | POA: Diagnosis not present

## 2021-12-12 DIAGNOSIS — L821 Other seborrheic keratosis: Secondary | ICD-10-CM | POA: Diagnosis not present

## 2021-12-12 DIAGNOSIS — L720 Epidermal cyst: Secondary | ICD-10-CM | POA: Diagnosis not present

## 2021-12-12 DIAGNOSIS — B351 Tinea unguium: Secondary | ICD-10-CM | POA: Diagnosis not present

## 2021-12-12 DIAGNOSIS — D1801 Hemangioma of skin and subcutaneous tissue: Secondary | ICD-10-CM | POA: Diagnosis not present

## 2021-12-17 ENCOUNTER — Ambulatory Visit (INDEPENDENT_AMBULATORY_CARE_PROVIDER_SITE_OTHER): Payer: Medicare Other

## 2021-12-17 DIAGNOSIS — I441 Atrioventricular block, second degree: Secondary | ICD-10-CM

## 2021-12-17 LAB — CUP PACEART REMOTE DEVICE CHECK
Battery Remaining Longevity: 151 mo
Battery Voltage: 3.06 V
Brady Statistic AP VP Percent: 3.72 %
Brady Statistic AP VS Percent: 0.14 %
Brady Statistic AS VP Percent: 73.91 %
Brady Statistic AS VS Percent: 22.23 %
Brady Statistic RA Percent Paced: 4.52 %
Brady Statistic RV Percent Paced: 77.63 %
Date Time Interrogation Session: 20230411034938
Implantable Lead Implant Date: 20220103
Implantable Lead Implant Date: 20220103
Implantable Lead Location: 753859
Implantable Lead Location: 753860
Implantable Lead Model: 3830
Implantable Lead Model: 5076
Implantable Pulse Generator Implant Date: 20220103
Lead Channel Impedance Value: 323 Ohm
Lead Channel Impedance Value: 361 Ohm
Lead Channel Impedance Value: 418 Ohm
Lead Channel Impedance Value: 494 Ohm
Lead Channel Pacing Threshold Amplitude: 0.5 V
Lead Channel Pacing Threshold Amplitude: 0.75 V
Lead Channel Pacing Threshold Pulse Width: 0.4 ms
Lead Channel Pacing Threshold Pulse Width: 0.4 ms
Lead Channel Sensing Intrinsic Amplitude: 1 mV
Lead Channel Sensing Intrinsic Amplitude: 1 mV
Lead Channel Sensing Intrinsic Amplitude: 21.625 mV
Lead Channel Sensing Intrinsic Amplitude: 21.625 mV
Lead Channel Setting Pacing Amplitude: 1.5 V
Lead Channel Setting Pacing Amplitude: 2 V
Lead Channel Setting Pacing Pulse Width: 0.4 ms
Lead Channel Setting Sensing Sensitivity: 1.2 mV

## 2022-01-03 NOTE — Progress Notes (Signed)
Remote pacemaker transmission.   

## 2022-01-13 DIAGNOSIS — Z23 Encounter for immunization: Secondary | ICD-10-CM | POA: Diagnosis not present

## 2022-01-13 DIAGNOSIS — Z20822 Contact with and (suspected) exposure to covid-19: Secondary | ICD-10-CM | POA: Diagnosis not present

## 2022-01-15 DIAGNOSIS — I495 Sick sinus syndrome: Secondary | ICD-10-CM | POA: Diagnosis not present

## 2022-01-15 DIAGNOSIS — Z79899 Other long term (current) drug therapy: Secondary | ICD-10-CM | POA: Diagnosis not present

## 2022-01-15 DIAGNOSIS — I480 Paroxysmal atrial fibrillation: Secondary | ICD-10-CM | POA: Diagnosis not present

## 2022-01-15 DIAGNOSIS — K5190 Ulcerative colitis, unspecified, without complications: Secondary | ICD-10-CM | POA: Diagnosis not present

## 2022-01-15 DIAGNOSIS — E7800 Pure hypercholesterolemia, unspecified: Secondary | ICD-10-CM | POA: Diagnosis not present

## 2022-01-15 DIAGNOSIS — Z950 Presence of cardiac pacemaker: Secondary | ICD-10-CM | POA: Diagnosis not present

## 2022-01-15 DIAGNOSIS — Z8546 Personal history of malignant neoplasm of prostate: Secondary | ICD-10-CM | POA: Diagnosis not present

## 2022-03-18 ENCOUNTER — Ambulatory Visit (INDEPENDENT_AMBULATORY_CARE_PROVIDER_SITE_OTHER): Payer: Medicare Other

## 2022-03-18 DIAGNOSIS — I441 Atrioventricular block, second degree: Secondary | ICD-10-CM | POA: Diagnosis not present

## 2022-03-18 LAB — CUP PACEART REMOTE DEVICE CHECK
Battery Remaining Longevity: 147 mo
Battery Voltage: 3.04 V
Brady Statistic AP VP Percent: 4.86 %
Brady Statistic AP VS Percent: 0.27 %
Brady Statistic AS VP Percent: 72.1 %
Brady Statistic AS VS Percent: 22.77 %
Brady Statistic RA Percent Paced: 6.12 %
Brady Statistic RV Percent Paced: 76.96 %
Date Time Interrogation Session: 20230710215121
Implantable Lead Implant Date: 20220103
Implantable Lead Implant Date: 20220103
Implantable Lead Location: 753859
Implantable Lead Location: 753860
Implantable Lead Model: 3830
Implantable Lead Model: 5076
Implantable Pulse Generator Implant Date: 20220103
Lead Channel Impedance Value: 342 Ohm
Lead Channel Impedance Value: 361 Ohm
Lead Channel Impedance Value: 437 Ohm
Lead Channel Impedance Value: 494 Ohm
Lead Channel Pacing Threshold Amplitude: 0.5 V
Lead Channel Pacing Threshold Amplitude: 0.75 V
Lead Channel Pacing Threshold Pulse Width: 0.4 ms
Lead Channel Pacing Threshold Pulse Width: 0.4 ms
Lead Channel Sensing Intrinsic Amplitude: 1 mV
Lead Channel Sensing Intrinsic Amplitude: 1 mV
Lead Channel Sensing Intrinsic Amplitude: 22.5 mV
Lead Channel Sensing Intrinsic Amplitude: 22.5 mV
Lead Channel Setting Pacing Amplitude: 1.5 V
Lead Channel Setting Pacing Amplitude: 2 V
Lead Channel Setting Pacing Pulse Width: 0.4 ms
Lead Channel Setting Sensing Sensitivity: 1.2 mV

## 2022-03-31 ENCOUNTER — Encounter: Payer: Self-pay | Admitting: Cardiovascular Disease

## 2022-03-31 NOTE — Progress Notes (Unsigned)
Cardiology Office Note   Date:  04/01/2022   ID:  Daniel Payne, DOB 12-24-1945, MRN [id number]  PCP:  Daniel Manes, MD  Cardiologist:   Daniel Moores, MD   Chief Complaint  Patient presents with   Atrial Fibrillation        1. Problem List:  1. Paroxysmal Atrial fib 2. Hyperlipidemia 3. Prostate surgery-status post robotic prostatectomy 4. Ulcerative colitis    Daniel Payne is a 76 y.o. 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  Has not fully controlled the bleeding. Best controlled by steroids.    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 He was cycling and went into rapid Afib with rate of 170.  He converted back to NSR in 3-4 hours. Had another episode of Afib while working in his garden.  Went to Dr.  Carlyle Payne office . Afib was documeneted.  Was started on Dilt 30 QID.  Converted to NSR the next day He stopped the Dilt a week or so later.  Has developed episodes of sinus tachycardia recently.  Also associated with some chest tightness.  Does not last long.   This am he had very slight chest tightness while he was walking.  Lasted 5  Minutes.  No diaphoresis, no radiation.  Drinks wine with dinner.  02/08/2015:  Skin is doing well. He has paroxysmal atrial fibrillation. He has not wanted to start anticoagulation yet. He's had some problems with colitis. He had a stress Myoview study as part of his workup for some vague chest tightness. Myoview revealed no evidence of ischemia. He had normal left ventricle systolic function with an ejection fraction of 75%. Has been started on Humira for Ulcerative colits . Has been cycling quite a bit.  No issues.   No recurrent episodes of atrial fib .  He thinks it may have been related to the high dose prednisone   Sept. 7, 2016:  Daniel Payne is doing well. He stopped his Eliquis  Doing  well.  Cycling regularly .  November 14, 2015:   Doing well.  Still cycling .    Just got back from [facility] for snow skiing .  Daniel Payne, [place], )  Very active .  His UC is well controlled  November 26, 2016  Daniel Payne is doing well Spent the past 8 days skiing our in [place] 20-25 days a year now  Does well even at high altitude. Also hiked last year in [place] without any problems   Jan 06, 2018 :  Daniel Payne is seen today for follow-up of his atrial fibrillation and hyperlipidemia.  He has maintained normal sinus rhythm. Very active.  Skis quite a bit .  Walks 2 -3 miles with the dogs 4 days . Rides 15-20 miles on his bike once a week . No palpitations  Doing well on the Dilt LA 120 mg a day   June 30, 2018:  Daniel Payne is seen as a work in visit today.  He was recently on a trans -atlantic  flight and was awake for 26 hours.  He went into atrial for ablation.  He tried an extra half a diltiazem tablet but he remains in atrial fib .   Slept well but has not converted to NSR   He is planning on going up to his house in [place], [place]  for the weekend.  Nov. 21, 2019  Daniel Payne is doing well,   He was seen for persistent AF on Oct. 23 . Started Eliquis He increased his Dilt to 120 BID, took an extra 30 mg of immediate release Dilt Converted to [name] a cold , went back into AF. Increased his Dilt again ( 120 BID + Dilt 30 ) converted back into NSR Continued Elquis for 1 week after wards  Feels great.   Is planning on going to [place] on Dec. 7 to ski.    Jan 10, 2020: Daniel Payne is seen today for follow-up visit.  He has a history of paroxysmal atrial fibrillation.  He has as needed Eliquis that he takes for a week after he has an episode of PAF. No further episodes of PAF after increasing his Diltiazem to 120  Mg CD BID .  Favorite ski spots  [place], [place] - [place]   Nov. 18, 2021: Daniel Payne is seen today for follow up of his PAF., hx of  HLD  He has also developed DOE while cycling   For the past 6 weeks, has had increasing fatigue.  Has trouble keeping up with his cycling  friends.  He has noted that his HR does not increase with exercise  O2 sats never drop below 98%.  No CP , pressure, burning, fullness   may need myoview or coronary Ct angio  Has PAF,  Has not had an episode since last year  - not since we increased his diltiazem to 120 CD  BID .  Dec. 3, 2021  Daniel Payne is seen as a work in visit today following his stress myoview.   He had poor chronotropic response with his treadmill.  HR was 70 initially and increased to 71 with ~2 minutes of exercise. We were asked to see him because of this poor chronotropic response.  Normally, Daniel Payne is a very active man.   He cycles ~15 - 20 miles with his cycling friends weekly but has not been able to keep up for the past month.   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   He normally takes Dilt 180 CD but had held this medication the day prior to and the day of the stress test.  The myoview revealed no ischemia He is scheduled to leave for [place] tomorrow AM to ski.  I reviewed the strips with Dr. Caryl Payne.   Our plan is to keep Daniel Payne off the Cardizem and repeat the GXT in a week to see if his chronotropic response improves.  Addendum  Dec. 5: Daniel Payne called last night from [place] and said he was flying back to [place].  Despite being off the Cardizem for 3 days , his HR remains slow and he is not able to ski at all.   He has requested that his GXT be scheduled for this upcoming week and to get an EP consult this week if possible.     Jan 18, 2021: Daniel Payne is seen . Hx of SSS. S/p pacer Because of his syncope, weakness, and pacer placement he missed most of the ski season last year. We have written a letter for a refund for his Epic pass.  Very active Rode his bike this week.  Got his HR up to  160 Has not had atrial fib . 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 Offered to let us pick blueberries   He has been told not to use a chain saw or gas powered weed trimmer    April 01, 2022  Daniel Payne is seen for follow up of his PAF, SSS, pacer He has noticed that he has PAF when he exercises at an altitude of > 10,000 feet ( [place] )  [employer] and Eliquis if he goes into Afib  Has a pacer  Lovena Le)  He has an Afib burden 0.01%  We have discussed the role of Eliquis in prevention of stroke in Afib.  He understands the risk but feels very confident in taking this PRN  Very active Cycles, skis out [place] Is a retired Materials engineer         Past Medical History:  Diagnosis Date   A-fib [facility])    ADHD (attention deficit hyperactivity disorder)    Cancer ([place])    prostate dr. Chriss Czar davis   Chronic kidney disease    borderline stage III   Fissure, anal    Giardia    working in [place]   Hyperlipidemia    Infectious mononucleosis    Low back pain    Presbycusis of left ear    Spinal stenosis of lumbar region    Ulcerative colitis [facility])     Past Surgical History:  Procedure Laterality Date   ANAL FISSURE REPAIR     APPENDECTOMY     PACEMAKER IMPLANT N/A 09/10/2020   Procedure: PACEMAKER IMPLANT;  Surgeon: Evans Lance, MD;  Location: [facility];  Service: Cardiovascular;  Laterality: N/A;   PROSTATE SURGERY     ROTATOR CUFF REPAIR Left    VARICOCELE EXCISION Right      Current Outpatient Medications  Medication Sig Dispense Refill   acetaminophen (TYLENOL) 500 MG tablet Take 500 mg by mouth at bedtime.      atorvastatin (LIPITOR) 10 MG tablet Take 10 mg by mouth daily.  11   diltiazem (DILT-XR) 120 MG 24 hr capsule Take 1 capsule (120 mg total) by mouth every evening. 90 capsule 3   diltiazem (DILT-XR) 180 MG 24 hr capsule Take 1 capsule (180 mg  total) by mouth every morning. 90 capsule 3   diphenhydrAMINE (BENADRYL) 25 MG tablet Take 25 mg by mouth at bedtime.      apixaban (ELIQUIS) 5 MG TABS tablet Take 5 mg by mouth 2 (two) times daily. (Patient not taking: Reported on 04/01/2022)     No current facility-administered medications for this visit.    Allergies:   Concerta [methylphenidate] and Lialda [mesalamine]    Social History:  The patient  reports that he has never smoked. He has never used smokeless tobacco. He reports current alcohol use. He reports that he does not use drugs.   Family History:  The patient's family history includes Heart attack in his father; Stroke (age of onset: 96) in his mother.    ROS:  Noted in current history, otherwise review of systems is negative.  Physical Exam: Blood pressure 116/70, pulse 66, height 5' 7.5" (1.715 m), weight 153 lb (69.4 kg), SpO2 98 %.  GEN:  Well nourished, well developed in no acute distress HEENT: Normal NECK: No JVD; No carotid bruits LYMPHATICS: No lymphadenopathy CARDIAC: RRR , no murmurs, rubs, gallops RESPIRATORY:  Clear to auscultation without rales, wheezing or rhonchi  ABDOMEN: Soft, non-tender, non-distended MUSCULOSKELETAL:  No edema; No deformity  SKIN: Warm and dry NEUROLOGIC:  Alert and oriented x 3    EKG:       Recent Labs: No results found for requested labs within last 365 days.    Lipid Panel No results found for: "CHOL", "TRIG", "HDL", "CHOLHDL", "VLDL", "LDLCALC", "LDLDIRECT"    Wt Readings from Last 3 Encounters:  04/01/22 153 lb (69.4 kg)  09/17/21 155 lb (70.3 kg)  01/18/21 155 lb 3.2 oz (70.4 kg)      Other studies Reviewed: Additional studies/ records that were reviewed today include: . Review of the above records demonstrates:    ASSESSMENT AND PLAN:  1.  Paroxysmal atrial fibrillation / SSS  S/p pacer  Af butden is 0.01 %  He takes his Eliquis and PRN Diltiazem for any episodes of PAF He is able to feel the  Afib immediately   We discussed Watchman, He has thought about ablation With his relatively low A-fib burden he is not inclined to do either at this point.   2.  Shortness of breath with exertion:  seems to have improved.   Able to do many activities without any significant dyspnea.      3.  Hyperlipidemia :   Lipids are followed by his primary medical doctor.  His lipid panel looks great.  LDL is 76.   Current medicines are reviewed at length with the patient today.  The patient does not have concerns regarding medicines.  The following changes have been made:      Disposition:       Signed, Daniel Moores, MD  04/01/2022 6:19 PM    [facility] [address], [place], [place]  [address] Phone: [phone]; Fax: [phone]

## 2022-04-01 ENCOUNTER — Ambulatory Visit (INDEPENDENT_AMBULATORY_CARE_PROVIDER_SITE_OTHER): Payer: Medicare Other | Admitting: Cardiovascular Disease

## 2022-04-01 ENCOUNTER — Encounter: Payer: Self-pay | Admitting: Cardiovascular Disease

## 2022-04-01 VITALS — BP 116/70 | HR 66 | Ht 67.5 in | Wt 153.0 lb

## 2022-04-01 DIAGNOSIS — I480 Paroxysmal atrial fibrillation: Secondary | ICD-10-CM

## 2022-04-01 NOTE — Patient Instructions (Signed)
Medication Instructions:  Your physician recommends that you continue on your current medications as directed. Please refer to the Current Medication list given to you today.  *If you need a refill on your cardiac medications before your next appointment, please call your pharmacy*   Follow-Up: At [facility], you and your health needs are our priority.  As part of our continuing mission to provide you with exceptional heart care, we have created designated Provider Care Teams.  These Care Teams include your primary Cardiologist (physician) and Advanced Practice Providers (APPs -  Physician Assistants and Nurse Practitioners) who all work together to provide you with the care you need, when you need it.  Your next appointment:   1 year(s)  The format for your next appointment:   In Person  Provider:   Mertie Moores, MD

## 2022-04-08 ENCOUNTER — Ambulatory Visit (INDEPENDENT_AMBULATORY_CARE_PROVIDER_SITE_OTHER): Payer: Medicare Other | Admitting: Orthopaedic Surgery

## 2022-04-08 ENCOUNTER — Encounter: Payer: Self-pay | Admitting: Orthopaedic Surgery

## 2022-04-08 ENCOUNTER — Ambulatory Visit (INDEPENDENT_AMBULATORY_CARE_PROVIDER_SITE_OTHER): Payer: Medicare Other

## 2022-04-08 DIAGNOSIS — G8929 Other chronic pain: Secondary | ICD-10-CM

## 2022-04-08 DIAGNOSIS — M25512 Pain in left shoulder: Secondary | ICD-10-CM

## 2022-04-08 MED ORDER — LIDOCAINE HCL 2 % IJ SOLN
2.0000 mL | INTRAMUSCULAR | Status: AC | PRN
  Administered 2022-04-08: 2 mL

## 2022-04-08 MED ORDER — BUPIVACAINE HCL 0.25 % IJ SOLN
2.0000 mL | INTRAMUSCULAR | Status: AC | PRN
  Administered 2022-04-08: 2 mL via INTRA_ARTICULAR

## 2022-04-08 MED ORDER — METHYLPREDNISOLONE ACETATE 40 MG/ML IJ SUSP
80.0000 mg | INTRAMUSCULAR | Status: AC | PRN
  Administered 2022-04-08: 80 mg via INTRA_ARTICULAR

## 2022-04-08 NOTE — Progress Notes (Signed)
Office Visit Note   Patient: Daniel Payne           Date of Birth: Apr 06, 1946           MRN: [id number] Visit Date: 04/08/2022              Requested by: Lajean Manes, MD [address] [place],  [place] [address] PCP: Lajean Manes, MD   Assessment & Plan: Visit Diagnoses:  1. Chronic left shoulder pain     Plan: [name] has had some problems with his left shoulder off-and-on for a number years .He has had a prior arthroscopy well over 20 years ago .When I saw him in 2021 he was experiencing some anterior pain consistent with a tendinitis.  He did well with a subacromial cortisone injection.  On this occasion he experienced pain while flyfishing.  Pain is experienced posteriorly and along the glenohumeral joint.  He has really not had any numbness or tingling.  Films demonstrate some degenerative changes of the glenohumeral joint which I suspect is causing most of his pain.  I will inject the glenohumeral space with Marcaine, Xylocaine and Depo-Medrol and monitor his response.  Some point we may want to consider an MRI scan  Follow-Up Instructions: Return if symptoms worsen or fail to improve.   Orders:  Orders Placed This Encounter  Procedures   XR Shoulder Left   No orders of the defined types were placed in this encounter.     Procedures: Large Joint Inj: L glenohumeral on 04/08/2022 2:51 PM Indications: pain and diagnostic evaluation Details: 25 G 1.5 in needle, anterolateral approach  Arthrogram: No  Medications: 2 mL lidocaine 2 %; 80 mg methylPREDNISolone acetate 40 MG/ML; 2 mL bupivacaine 0.25 % Consent was given by the patient. Immediately prior to procedure a time out was called to verify the correct patient, procedure, equipment, support staff and site/side marked as required. Patient was prepped and draped in the usual sterile fashion.       Clinical Data: No additional findings.   Subjective: Chief Complaint  Patient presents with   Left  Shoulder - Pain  Recent onset of recurrent left shoulder pain while flyfishing.  Experiencing pain mostly along the posterior glenohumeral joint.  Some difficulty with external rotation and some mild pain with overhead motion.  HPI  Review of Systems   Objective: Vital Signs: There were no vitals taken for this visit.  Physical Exam Constitutional:      Appearance: He is well-developed.  Pulmonary:     Effort: Pulmonary effort is normal.  Skin:    General: Skin is warm and dry.  Neurological:     Mental Status: He is alert and oriented to person, place, and time.  Psychiatric:        Behavior: Behavior normal.     Ortho Exam awake alert and oriented x3.  Comfortable sitting.  Able to place his left arm fully overhead.  Has a little lack of external rotation and feels a little crepitation with certain motions.  Good strength.  Biceps intact.  No anterior lateral subacromial pain and no pain at the [facility] joint.  Good grip and release  Specialty Comments:  No specialty comments available.  Imaging: XR Shoulder Left  Result Date: 04/08/2022 Numbness of the left shoulder obtained in 3 projections.  There is some narrowing of the glenohumeral joint and some inferior sclerosis and very small inferior osteophyte all consistent with glenohumeral arthritis.  A few subchondral cysts  were identified.  Prior [facility] joint resection.  Did not appear to have any significant downsloping of the acromion.    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 Chriss Czar davis   Chronic kidney disease    borderline stage III   Fissure, anal    Giardia    working in [place]   Hyperlipidemia    Infectious mononucleosis    Low back pain    Presbycusis of left ear    Spinal stenosis of lumbar region    Ulcerative colitis ([place])     Family History  Problem Relation Age of Onset   Stroke Mother 31   Heart attack Father     Past Surgical History:  Procedure Laterality Date   ANAL FISSURE REPAIR     APPENDECTOMY     PACEMAKER IMPLANT N/A 09/10/2020   Procedure: PACEMAKER IMPLANT;  Surgeon: Evans Lance, MD;  Location: [facility];  Service: Cardiovascular;  Laterality: N/A;   PROSTATE SURGERY     ROTATOR CUFF REPAIR Left    VARICOCELE EXCISION Right    Social History   Occupational History   Not on file  Tobacco Use   Smoking status: Never   Smokeless tobacco: Never  Vaping Use   Vaping Use: Never used  Substance and Sexual Activity   Alcohol use: Yes    Alcohol/week: 0.0 standard drinks of alcohol    Comment: 1-2 glasses of red wine   Drug use: No   Sexual activity: Not on file     Garald Balding, MD   Note - This record has been created using [employer].  Chart creation errors have been sought, but may not always  have been located. Such creation errors do not reflect on  the standard of medical care.

## 2022-04-09 NOTE — Progress Notes (Signed)
Remote pacemaker transmission.   

## 2022-06-02 DIAGNOSIS — Z23 Encounter for immunization: Secondary | ICD-10-CM | POA: Diagnosis not present

## 2022-06-17 ENCOUNTER — Ambulatory Visit (INDEPENDENT_AMBULATORY_CARE_PROVIDER_SITE_OTHER): Payer: Medicare Other

## 2022-06-17 DIAGNOSIS — I480 Paroxysmal atrial fibrillation: Secondary | ICD-10-CM

## 2022-06-17 LAB — CUP PACEART REMOTE DEVICE CHECK
Battery Remaining Longevity: 145 mo
Battery Voltage: 3.03 V
Brady Statistic AP VP Percent: 4.62 %
Brady Statistic AP VS Percent: 0.63 %
Brady Statistic AS VP Percent: 52.16 %
Brady Statistic AS VS Percent: 42.59 %
Brady Statistic RA Percent Paced: 6.93 %
Brady Statistic RV Percent Paced: 56.78 %
Date Time Interrogation Session: 20231009234843
Implantable Lead Implant Date: 20220103
Implantable Lead Implant Date: 20220103
Implantable Lead Location: 753859
Implantable Lead Location: 753860
Implantable Lead Model: 3830
Implantable Lead Model: 5076
Implantable Pulse Generator Implant Date: 20220103
Lead Channel Impedance Value: 361 Ohm
Lead Channel Impedance Value: 361 Ohm
Lead Channel Impedance Value: 437 Ohm
Lead Channel Impedance Value: 494 Ohm
Lead Channel Pacing Threshold Amplitude: 0.5 V
Lead Channel Pacing Threshold Amplitude: 0.75 V
Lead Channel Pacing Threshold Pulse Width: 0.4 ms
Lead Channel Pacing Threshold Pulse Width: 0.4 ms
Lead Channel Sensing Intrinsic Amplitude: 0.75 mV
Lead Channel Sensing Intrinsic Amplitude: 0.75 mV
Lead Channel Sensing Intrinsic Amplitude: 24.875 mV
Lead Channel Sensing Intrinsic Amplitude: 24.875 mV
Lead Channel Setting Pacing Amplitude: 1.5 V
Lead Channel Setting Pacing Amplitude: 2 V
Lead Channel Setting Pacing Pulse Width: 0.4 ms
Lead Channel Setting Sensing Sensitivity: 1.2 mV

## 2022-07-01 DIAGNOSIS — Z23 Encounter for immunization: Secondary | ICD-10-CM | POA: Diagnosis not present

## 2022-07-01 NOTE — Progress Notes (Signed)
Remote pacemaker transmission.   

## 2022-08-06 DIAGNOSIS — J851 Abscess of lung with pneumonia: Secondary | ICD-10-CM | POA: Diagnosis not present

## 2022-08-06 DIAGNOSIS — I700 Atherosclerosis of aorta: Secondary | ICD-10-CM | POA: Diagnosis not present

## 2022-08-06 DIAGNOSIS — K5190 Ulcerative colitis, unspecified, without complications: Secondary | ICD-10-CM | POA: Diagnosis not present

## 2022-08-06 DIAGNOSIS — I480 Paroxysmal atrial fibrillation: Secondary | ICD-10-CM | POA: Diagnosis not present

## 2022-08-06 DIAGNOSIS — I495 Sick sinus syndrome: Secondary | ICD-10-CM | POA: Diagnosis not present

## 2022-08-06 DIAGNOSIS — Z79899 Other long term (current) drug therapy: Secondary | ICD-10-CM | POA: Diagnosis not present

## 2022-08-06 DIAGNOSIS — Z950 Presence of cardiac pacemaker: Secondary | ICD-10-CM | POA: Diagnosis not present

## 2022-08-06 DIAGNOSIS — Z23 Encounter for immunization: Secondary | ICD-10-CM | POA: Diagnosis not present

## 2022-08-06 DIAGNOSIS — Z0000 Encounter for general adult medical examination without abnormal findings: Secondary | ICD-10-CM | POA: Diagnosis not present

## 2022-08-06 DIAGNOSIS — E7800 Pure hypercholesterolemia, unspecified: Secondary | ICD-10-CM | POA: Diagnosis not present

## 2022-08-06 DIAGNOSIS — Z8546 Personal history of malignant neoplasm of prostate: Secondary | ICD-10-CM | POA: Diagnosis not present

## 2022-08-07 ENCOUNTER — Other Ambulatory Visit: Payer: Self-pay | Admitting: Internal Medicine

## 2022-08-07 DIAGNOSIS — J851 Abscess of lung with pneumonia: Secondary | ICD-10-CM

## 2022-08-08 ENCOUNTER — Ambulatory Visit
Admission: RE | Admit: 2022-08-08 | Discharge: 2022-08-08 | Disposition: A | Discharge status: Home / Self Care | Payer: Medicare Other | Source: Ambulatory Visit | Attending: Internal Medicine | Admitting: Internal Medicine

## 2022-08-08 DIAGNOSIS — J479 Bronchiectasis, uncomplicated: Secondary | ICD-10-CM | POA: Diagnosis not present

## 2022-08-08 DIAGNOSIS — J851 Abscess of lung with pneumonia: Secondary | ICD-10-CM

## 2022-08-08 DIAGNOSIS — I700 Atherosclerosis of aorta: Secondary | ICD-10-CM | POA: Diagnosis not present

## 2022-08-08 DIAGNOSIS — R911 Solitary pulmonary nodule: Secondary | ICD-10-CM | POA: Diagnosis not present

## 2022-08-20 ENCOUNTER — Telehealth: Payer: Self-pay | Admitting: Cardiovascular Disease

## 2022-08-20 MED ORDER — DILTIAZEM HCL ER 120 MG PO CP24: 120.0000 mg | ORAL_CAPSULE | Freq: Every evening | ORAL | 2 refills | Status: DC

## 2022-08-20 MED ORDER — DILTIAZEM HCL ER 180 MG PO CP24: 180.0000 mg | ORAL_CAPSULE | Freq: Every morning | ORAL | 2 refills | Status: DC

## 2022-08-20 NOTE — Telephone Encounter (Signed)
Pt's medications were sent to pt's pharmacy as requested. Confirmation received.  

## 2022-08-20 NOTE — Telephone Encounter (Signed)
*  STAT* If patient is at the pharmacy, call can be transferred to refill team.   1. Which medications need to be refilled? (please list name of each medication and dose if known)   diltiazem (DILT-XR) 180 MG 24 hr capsule  2. Which pharmacy/location (including street and city if local pharmacy) is medication to be sent to?  [place], [place]   3. Do they need a 30 day or 90 day supply?  90 day supply.   Patient stated he will be going to [place] and will need to take additional medication at higher altitudes.  Patient stated he is running out of this medication and will need this medication prior to his travel on 12/15.

## 2022-08-22 ENCOUNTER — Encounter (HOSPITAL_BASED_OUTPATIENT_CLINIC_OR_DEPARTMENT_OTHER): Payer: Self-pay | Admitting: Pulmonary Disease

## 2022-08-22 ENCOUNTER — Ambulatory Visit (INDEPENDENT_AMBULATORY_CARE_PROVIDER_SITE_OTHER): Payer: Medicare Other | Admitting: Pulmonary Disease

## 2022-08-22 VITALS — BP 140/82 | HR 81 | Ht 67.5 in | Wt 152.8 lb

## 2022-08-22 DIAGNOSIS — J189 Pneumonia, unspecified organism: Secondary | ICD-10-CM | POA: Diagnosis not present

## 2022-08-22 NOTE — Patient Instructions (Signed)
Severe pneumonia Reviewed CT imaging. No evidence of cavitary pneumonia. Rather these are bronchograms noted in areas of consolidation in right upper lobe lung. Serial imaging demonstrates resolving lesion. Low suspicion for malignancy at this time. --No further imaging or PET indicated at this point  Follow-up with me as needed

## 2022-08-22 NOTE — Progress Notes (Signed)
Subjective:   PATIENT ID: Daniel Payne GENDER: male DOB: 1946-04-20, MRN: [id number]  Chief Complaint  Patient presents with   Consult    Lung mass    Reason for Visit: New consult for right lung abscess  Dr. Rosezetta Schlatter is a 76 year old male never smoker with  ulcerative colitis, HLD, atrial fib/SSS s/p PM, CKD stage III, prostate malignancy s/p robotic prostatectomy who presents as a new consult  He reports RUL abscess initially diagnosed in 06/2021 and completed antibiotics x 2 weeks. Repeat scan with improvement. He established by new PCP who ordered repeat CT scan. Denies fever, chills  PCP note by Dr. Koleen Nimrod from 08/06/22 reviewed. Treated abscess with Augmentin, unclear time period. Patient reports 2 weeks. Currently patient has no fevers, chill, cough, wheezing or shortness of breath. Has normal energy and keeps busy.  Social History: Med Onc - started cancer center. 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  Past Medical History:  Diagnosis Date   A-fib [facility])    ADHD (attention deficit hyperactivity disorder)    Cancer ([place])    prostate dr. Chriss Czar davis   Chronic kidney disease    borderline stage III   Fissure, anal    Giardia    working in [place]   Hyperlipidemia    Infectious mononucleosis    Low back pain    Presbycusis of left ear    Spinal stenosis of lumbar region    Ulcerative colitis ([place])      Family History  Problem Relation Age of Onset   Stroke Mother 48   Heart attack Father      Social History   Occupational History   Not on file  Tobacco Use   Smoking status: Never   Smokeless tobacco: Never  Vaping Use   Vaping Use: Never used  Substance and Sexual Activity   Alcohol use: Yes    Alcohol/week: 0.0 standard drinks of alcohol    Comment: 1-2 glasses of red wine   Drug use: No   Sexual activity: Not on file     Allergies  Allergen Reactions   Concerta [Methylphenidate] Other (See Comments)    Burning mouth   Lialda [Mesalamine] Diarrhea     Outpatient Medications Prior to Visit  Medication Sig Dispense Refill   acetaminophen (TYLENOL) 500 MG tablet Take 500 mg by mouth at bedtime.      atorvastatin (LIPITOR) 10 MG tablet Take 10 mg by mouth daily.  11   diltiazem (DILT-XR) 120 MG 24 hr capsule Take 1 capsule (120 mg total) by mouth every evening. 90 capsule 2   diltiazem (DILT-XR) 180 MG 24 hr capsule Take 1 capsule (180 mg total) by mouth every morning. 90 capsule 2   diphenhydrAMINE (BENADRYL) 25 MG tablet Take 25 mg by mouth at bedtime.      apixaban (ELIQUIS) 5 MG TABS tablet Take 5 mg by mouth 2 (two) times daily. (Patient not taking: Reported on 04/01/2022)     No facility-administered medications prior to visit.    Review of Systems  Constitutional:  Negative for chills, diaphoresis, fever, malaise/fatigue and weight loss.  HENT:  Negative for congestion.   Respiratory:  Negative for cough, hemoptysis, sputum production, shortness of breath and wheezing.   Cardiovascular:  Negative for chest pain, palpitations and leg swelling.     Objective:   Vitals:   08/22/22 1324  BP: Marland Kitchen)  140/82  Pulse: 81  SpO2: 98%  Weight: 152 lb 12.5 oz (69.3 kg)  Height: 5' 7.5" (1.715 m)   SpO2: 98 % O2 Device: None (Room air)  Physical Exam: General: Well-appearing, no acute distress HENT: [place], AT Eyes: EOMI, no scleral icterus Respiratory: Clear to auscultation bilaterally.  No crackles, wheezing or rales Cardiovascular: RRR, -M/R/G, no JVD Extremities:-Edema,-tenderness Neuro: AAO x4, CNII-XII grossly intact Psych: Normal mood, normal affect  Data Reviewed:  Imaging: CT Chest 09/08/21 - RUL nodular mass 1/9 x 1.3, chronic post-infectious/inflammatory scarring with residual fluid-filled cavity. Surrounding gorund galss with septal thicekning. Mild bronchiectasis in RUL unchanged.  LLL nodule measuring 4 mm, RLL measuring 65m, both stable compared to 06/21/21.  PFT: None on file  Labs: CBC    Component Value Date/Time   WBC 8.7 08/21/2020 1248   WBC 13.3 (H) 03/01/2015 0941   RBC 4.60 08/21/2020 1248   RBC 4.99 03/01/2015 0941   HGB 14.4 08/21/2020 1248   HCT 41.8 08/21/2020 1248   PLT 217 08/21/2020 1248   MCV 91 08/21/2020 1248   MCH 31.3 08/21/2020 1248   MCHC 34.4 08/21/2020 1248   MCHC 33.7 03/01/2015 0941   RDW 11.7 08/21/2020 1248   LYMPHSABS 2.9 08/21/2020 1248   MONOABS 0.6 03/01/2015 0941   EOSABS 0.1 08/21/2020 1248   BASOSABS 0.1 08/21/2020 1248   No recent      Assessment & Plan:   Discussion:  76year old male never smoker with  ulcerative colitis, HLD, atrial fib/SSS s/p PM, CKD stage III, prostate malignancy s/p robotic prostatectomy who presents as a new consult for right lung abscess. Slow to resolve and unclear if he received adequate antibiotic duration. Lesion does not appear cavitary on my review and actually represents air bronchograms in right upper lobe consolidation. Doubt this is active infection. Cannot rule out malignancy however suspicion lower in setting of lesion improvement. We discussed utility in serial imaging and unless he develops worsening symptoms, would hold off on further imaging per patient preference. Discussed plan with his PCP who agrees.   Severe pneumonia  Reviewed CT imaging. No evidence of cavitary pneumonia. Rather these are bronchograms noted in areas of consolidation in right upper lobe lung. Serial imaging demonstrates resolving lesion. Low suspicion for malignancy at this time. --No further imaging or PET indicated at this point  Health Maintenance Immunization History  Administered Date(s) Administered   Hepatitis B, PED/ADOLESCENT 11/20/1993   Influenza Split 06/14/2013   Influenza, High Dose Seasonal PF 05/29/2018   Influenza-Unspecified 06/17/2011, 06/19/2015   Pneumococcal Conjugate-13  03/28/2014   Pneumococcal Polysaccharide-23 01/23/2010   Td 02/16/2002   Tdap 03/10/2012   Zoster Recombinat (Shingrix) 01/31/2018, 04/12/2018   Zoster, Live 11/18/2006   CT Lung Screen - never smoker. Not a candidate  No orders of the defined types were placed in this encounter. No orders of the defined types were placed in this encounter.   Return if symptoms worsen or fail to improve.  I have spent a total time of 45-minutes on the day of the appointment reviewing prior documentation, coordinating care and discussing medical diagnosis and plan with the patient/family. Imaging, labs and tests included in this note have been reviewed and interpreted independently by me.  CChunky MD [facility] 08/22/2022 2:46 PM  Office Number [phone]

## 2022-09-15 ENCOUNTER — Telehealth: Payer: Self-pay | Admitting: Cardiovascular Disease

## 2022-09-15 NOTE — Telephone Encounter (Signed)
Patient stated he would like to get orders for a Stress Echo test.

## 2022-09-15 NOTE — Telephone Encounter (Signed)
Pt called [facility] wanting to obtain an order to have a stress test this week if possible?  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  Pt states he has increased shortness of breath, and increased weakness w/o angina.   Pt had covid 2 years ago, and a mass was discovered in his RT lower lobe of his lung.  This mass has decreased slightly, but his calcium has increased significantly in all 3 coronary arteries, and he is concerned about these findings with his current symptoms.    Dr. Sonny Dandy asked me to reach out to the Stress test department to hold an appointment time, and obtain an order from Dr. Lovena Le, to get a stress test this week if at all possible?  I private messaged Ms. Valetta Fuller of stress test dept to ask if this is possible, and consulted Mr. Tillery PA-C for an appointment.  Pt needed a provider appointment, and will see Mr. Tillery PA-C on 09/17/2022 at noon, to assess his symptoms listed above and discuss Stress Test.

## 2022-09-15 NOTE — Telephone Encounter (Signed)
Pt calling back for an update on getting orders. Informed him the message has been sent to Dr. Acie Fredrickson RN, however he is asking that Dr. Lovena Le order the test instead.

## 2022-09-16 ENCOUNTER — Ambulatory Visit (INDEPENDENT_AMBULATORY_CARE_PROVIDER_SITE_OTHER): Payer: Medicare Other

## 2022-09-16 DIAGNOSIS — I441 Atrioventricular block, second degree: Secondary | ICD-10-CM

## 2022-09-16 LAB — CUP PACEART REMOTE DEVICE CHECK
Battery Remaining Longevity: 141 mo
Battery Voltage: 3.03 V
Brady Statistic AP VP Percent: 1.22 %
Brady Statistic AP VS Percent: 0.05 %
Brady Statistic AS VP Percent: 74.92 %
Brady Statistic AS VS Percent: 23.81 %
Brady Statistic RA Percent Paced: 1.3 %
Brady Statistic RV Percent Paced: 74.95 %
Date Time Interrogation Session: 20240108232052
Implantable Lead Connection Status: 753985
Implantable Lead Connection Status: 753985
Implantable Lead Implant Date: 20220103
Implantable Lead Implant Date: 20220103
Implantable Lead Location: 753859
Implantable Lead Location: 753860
Implantable Lead Model: 3830
Implantable Lead Model: 5076
Implantable Pulse Generator Implant Date: 20220103
Lead Channel Impedance Value: 361 Ohm
Lead Channel Impedance Value: 361 Ohm
Lead Channel Impedance Value: 437 Ohm
Lead Channel Impedance Value: 494 Ohm
Lead Channel Pacing Threshold Amplitude: 0.5 V
Lead Channel Pacing Threshold Amplitude: 0.75 V
Lead Channel Pacing Threshold Pulse Width: 0.4 ms
Lead Channel Pacing Threshold Pulse Width: 0.4 ms
Lead Channel Sensing Intrinsic Amplitude: 1.25 mV
Lead Channel Sensing Intrinsic Amplitude: 1.25 mV
Lead Channel Sensing Intrinsic Amplitude: 26.625 mV
Lead Channel Sensing Intrinsic Amplitude: 26.625 mV
Lead Channel Setting Pacing Amplitude: 1.5 V
Lead Channel Setting Pacing Amplitude: 2 V
Lead Channel Setting Pacing Pulse Width: 0.4 ms
Lead Channel Setting Sensing Sensitivity: 1.2 mV
Zone Setting Status: 755011

## 2022-09-16 NOTE — Progress Notes (Unsigned)
Electrophysiology Office Note Date: 09/16/2022  ID:  Daniel Payne, DOB 30-Sep-1945, MRN [id number]  PCP: Lajean Manes, MD Primary Cardiologist: Mertie Moores, MD Electrophysiologist: Cristopher Peru, MD   CC: Pacemaker follow-up  Daniel Payne is a 77 y.o. male seen today for Cristopher Peru, MD for acute visit due to worsening SOB.    Patient reports ***.  he denies chest pain, palpitations, dyspnea, PND, orthopnea, nausea, vomiting, dizziness, syncope, edema, weight gain, or early satiety.   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 Chriss Czar davis   Chronic kidney disease    borderline stage III   Fissure, anal    Giardia    working in [place]   Hyperlipidemia    Infectious mononucleosis    Low back pain    Presbycusis of left ear    Spinal stenosis of lumbar region    Ulcerative colitis [facility])    Past Surgical History:  Procedure Laterality Date   ANAL FISSURE REPAIR     APPENDECTOMY     PACEMAKER IMPLANT N/A 09/10/2020   Procedure: PACEMAKER IMPLANT;  Surgeon: Evans Lance, MD;  Location: [place] [facility];  Service: Cardiovascular;  Laterality: N/A;   PROSTATE SURGERY     ROTATOR CUFF REPAIR Left    VARICOCELE EXCISION Right     Current Outpatient Medications  Medication Instructions   acetaminophen (TYLENOL) 500 mg, Oral, Daily at bedtime   apixaban (ELIQUIS) 5 mg, 2 times daily   atorvastatin (LIPITOR) 10 mg, Oral, Daily   diltiazem (DILT-XR) 120 mg, Oral, Every evening   diltiazem (DILT-XR) 180 mg, Oral, Every morning   diphenhydrAMINE (BENADRYL) 25 mg, Oral, Daily at bedtime    Family History: Family History  Problem Relation Age of Onset   Stroke Mother 21   Heart attack Father     Physical Exam: There were no vitals filed for this visit.   GEN- The  patient is well appearing, alert and oriented x 3 today.   HEENT: normocephalic, atraumatic; sclera clear, conjunctiva pink; hearing intact; oropharynx clear; neck supple, no JVP Lymph- no cervical lymphadenopathy Lungs- Clear to ausculation bilaterally, normal work of breathing.  No wheezes, rales, rhonchi Heart- {Blank single:19197::"Regular","Irregularly irregular"}  rate and rhythm, no murmurs, rubs or gallops, PMI not laterally displaced GI- soft, non-tender, non-distended, bowel sounds present, no hepatosplenomegaly Extremities- no clubbing or cyanosis. {EDEMA WYOVZ:85885} peripheral edema; DP/PT/radial pulses 2+ bilaterally MS- no significant deformity or atrophy Skin- warm and dry, no rash or lesion; PPM pocket well healed Psych- euthymic mood, full affect Neuro- strength and sensation are intact  PPM Interrogation-  reviewed in detail today,  See PACEART report.  EKG:  EKG {ACTION; IS/IS OYD:[id number]} ordered today. Personal review of ekg ordered {Blank single:19197::"today","***"} shows ***   Other studies Reviewed: Additional studies/ records that were reviewed today include: Previous EP office notes, Previous remote checks, Most recent labwork.   {Select studies to display:26339}  Assessment and Plan:  1. 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 Paroxysmal atrial fibrillation Dr. Lovena Le has recommended he continue Eliquis for now   Current medicines are reviewed at length with the patient today.    Labs/ tests ordered today include: *** No orders of  the defined types were placed in this encounter.    Disposition:   Follow up with {EPPROVIDERS:28135} in {Blank single:19197::"2 weeks","4 weeks","3 months","6 months","12 months","as usual post gen change"}    Signed, Annamaria Helling  09/16/2022 8:18 AM  [facility] [address] [place]  [address] [phone] (office) [phone] (fax)

## 2022-09-16 NOTE — Telephone Encounter (Signed)
Per Dr. Lovena Le, [place], and Pt will see Mr. Tillery PA-C to discuss in greater detail on 09/17/22.    Dr Lovena Le wanted Dr Acie Fredrickson involved with care plan.  Pt in past note agreed to this appointment.

## 2022-09-17 ENCOUNTER — Encounter: Payer: Self-pay | Admitting: Student

## 2022-09-17 ENCOUNTER — Ambulatory Visit: Payer: Medicare Other | Attending: Student | Admitting: Student

## 2022-09-17 VITALS — BP 112/68 | HR 64 | Ht 67.5 in | Wt 153.6 lb

## 2022-09-17 DIAGNOSIS — R0602 Shortness of breath: Secondary | ICD-10-CM | POA: Diagnosis not present

## 2022-09-17 DIAGNOSIS — E7800 Pure hypercholesterolemia, unspecified: Secondary | ICD-10-CM

## 2022-09-17 DIAGNOSIS — I480 Paroxysmal atrial fibrillation: Secondary | ICD-10-CM | POA: Diagnosis not present

## 2022-09-17 DIAGNOSIS — I441 Atrioventricular block, second degree: Secondary | ICD-10-CM | POA: Diagnosis not present

## 2022-09-17 LAB — CUP PACEART INCLINIC DEVICE CHECK
Battery Remaining Longevity: 141 mo
Battery Voltage: 3.03 V
Brady Statistic AP VP Percent: 4.03 %
Brady Statistic AP VS Percent: 0.33 %
Brady Statistic AS VP Percent: 65.36 %
Brady Statistic AS VS Percent: 30.29 %
Brady Statistic RA Percent Paced: 5.15 %
Brady Statistic RV Percent Paced: 68.95 %
Date Time Interrogation Session: 20240110125346
Implantable Lead Connection Status: 753985
Implantable Lead Connection Status: 753985
Implantable Lead Implant Date: 20220103
Implantable Lead Implant Date: 20220103
Implantable Lead Location: 753859
Implantable Lead Location: 753860
Implantable Lead Model: 3830
Implantable Lead Model: 5076
Implantable Pulse Generator Implant Date: 20220103
Lead Channel Impedance Value: 361 Ohm
Lead Channel Impedance Value: 361 Ohm
Lead Channel Impedance Value: 361 Ohm
Lead Channel Impedance Value: 361 Ohm
Lead Channel Impedance Value: 437 Ohm
Lead Channel Impedance Value: 437 Ohm
Lead Channel Impedance Value: 494 Ohm
Lead Channel Impedance Value: 494 Ohm
Lead Channel Pacing Threshold Amplitude: 0.5 V
Lead Channel Pacing Threshold Amplitude: 0.5 V
Lead Channel Pacing Threshold Amplitude: 0.75 V
Lead Channel Pacing Threshold Amplitude: 0.75 V
Lead Channel Pacing Threshold Pulse Width: 0.4 ms
Lead Channel Pacing Threshold Pulse Width: 0.4 ms
Lead Channel Pacing Threshold Pulse Width: 0.4 ms
Lead Channel Pacing Threshold Pulse Width: 0.4 ms
Lead Channel Sensing Intrinsic Amplitude: 1.375 mV
Lead Channel Sensing Intrinsic Amplitude: 1.375 mV
Lead Channel Sensing Intrinsic Amplitude: 2.375 mV
Lead Channel Sensing Intrinsic Amplitude: 2.375 mV
Lead Channel Sensing Intrinsic Amplitude: 26.625 mV
Lead Channel Sensing Intrinsic Amplitude: 26.625 mV
Lead Channel Setting Pacing Amplitude: 1.5 V
Lead Channel Setting Pacing Amplitude: 2 V
Lead Channel Setting Pacing Pulse Width: 0.4 ms
Lead Channel Setting Sensing Sensitivity: 1.2 mV
Zone Setting Status: 755011

## 2022-09-17 MED ORDER — APIXABAN 5 MG PO TABS: 5.0000 mg | ORAL_TABLET | Freq: Two times a day (BID) | ORAL | 3 refills | Status: DC

## 2022-09-17 NOTE — Patient Instructions (Signed)
Medication Instructions:  Your physician recommends that you continue on your current medications as directed. Please refer to the Current Medication list given to you today.  *If you need a refill on your cardiac medications before your next appointment, please call your pharmacy*   Lab Work: TODAY: CMET, CBC, Lipid, LipoA  If you have labs (blood work) drawn today and your tests are completely normal, you will receive your results only by: [place] (if you have MyChart) OR A paper copy in the mail If you have any lab test that is abnormal or we need to change your treatment, we will call you to review the results.   Testing/Procedures: Coronary CTA   Follow-Up: At [facility], you and your health needs are our priority.  As part of our continuing mission to provide you with exceptional heart care, we have created designated Provider Care Teams.  These Care Teams include your primary Cardiologist (physician) and Advanced Practice Providers (APPs -  Physician Assistants and Nurse Practitioners) who all work together to provide you with the care you need, when you need it.  Your next appointment:   1 year(s)  The format for your next appointment:   In Person  Provider:   Cristopher Peru, MD     Important Information About Sugar

## 2022-09-18 ENCOUNTER — Ambulatory Visit: Payer: Medicare Other | Attending: Student

## 2022-09-18 DIAGNOSIS — E7800 Pure hypercholesterolemia, unspecified: Secondary | ICD-10-CM

## 2022-09-18 DIAGNOSIS — I441 Atrioventricular block, second degree: Secondary | ICD-10-CM | POA: Diagnosis not present

## 2022-09-18 DIAGNOSIS — R0602 Shortness of breath: Secondary | ICD-10-CM

## 2022-09-18 DIAGNOSIS — I480 Paroxysmal atrial fibrillation: Secondary | ICD-10-CM | POA: Diagnosis not present

## 2022-09-18 LAB — CBC WITH DIFFERENTIAL/PLATELET

## 2022-09-19 LAB — CBC WITH DIFFERENTIAL/PLATELET
Basophils Absolute: 0.1 10*3/uL (ref 0.0–0.2)
Basos: 1 %
EOS (ABSOLUTE): 0.2 10*3/uL (ref 0.0–0.4)
Eos: 2 %
Hematocrit: 45.6 % (ref 37.5–51.0)
Hemoglobin: 16.1 g/dL (ref 13.0–17.7)
Immature Grans (Abs): 0 10*3/uL (ref 0.0–0.1)
Immature Granulocytes: 0 %
Lymphocytes Absolute: 2.6 10*3/uL (ref 0.7–3.1)
Lymphs: 35 %
MCH: 32.3 pg (ref 26.6–33.0)
MCHC: 35.3 g/dL (ref 31.5–35.7)
MCV: 91 fL (ref 79–97)
Monocytes Absolute: 0.9 10*3/uL (ref 0.1–0.9)
Monocytes: 12 %
Neutrophils Absolute: 3.8 10*3/uL (ref 1.4–7.0)
Neutrophils: 50 %
Platelets: 248 10*3/uL (ref 150–450)
RBC: 4.99 x10E6/uL (ref 4.14–5.80)
RDW: 12 % (ref 11.6–15.4)
WBC: 7.6 10*3/uL (ref 3.4–10.8)

## 2022-09-19 LAB — LIPID PANEL
Chol/HDL Ratio: 2.4 ratio (ref 0.0–5.0)
Cholesterol, Total: 176 mg/dL (ref 100–199)
HDL: 74 mg/dL (ref >39)
LDL Chol Calc (NIH): 88 mg/dL (ref 0–99)
Triglycerides: 72 mg/dL (ref 0–149)
VLDL Cholesterol Cal: 14 mg/dL (ref 5–40)

## 2022-09-19 LAB — LIPOPROTEIN A (LPA): Lipoprotein (a): <8.4 nmol/L (ref <75.0)

## 2022-09-19 LAB — COMPREHENSIVE METABOLIC PANEL
ALT: 23 IU/L (ref 0–44)
AST: 21 IU/L (ref 0–40)
Albumin/Globulin Ratio: 1.8 (ref 1.2–2.2)
Albumin: 4.6 g/dL (ref 3.8–4.8)
Alkaline Phosphatase: 71 IU/L (ref 44–121)
BUN/Creatinine Ratio: 18 (ref 10–24)
BUN: 24 mg/dL (ref 8–27)
Bilirubin Total: 0.3 mg/dL (ref 0.0–1.2)
CO2: 26 mmol/L (ref 20–29)
Calcium: 9.7 mg/dL (ref 8.6–10.2)
Chloride: 98 mmol/L (ref 96–106)
Creatinine, Ser: 1.32 mg/dL — ABNORMAL HIGH (ref 0.76–1.27)
Globulin, Total: 2.5 g/dL (ref 1.5–4.5)
Glucose: 100 mg/dL — ABNORMAL HIGH (ref 70–99)
Potassium: 4.9 mmol/L (ref 3.5–5.2)
Sodium: 138 mmol/L (ref 134–144)
Total Protein: 7.1 g/dL (ref 6.0–8.5)
eGFR: 56 mL/min/{1.73_m2} — ABNORMAL LOW (ref >59)

## 2022-09-22 ENCOUNTER — Telehealth (HOSPITAL_COMMUNITY): Payer: Self-pay | Admitting: *Deleted

## 2022-09-22 NOTE — Telephone Encounter (Signed)
Reaching out to patient to offer assistance regarding upcoming cardiac imaging study; pt verbalizes understanding of appt date/time, parking situation and where to check in, pre-test NPO status, and verified current allergies; name and call back number provided for further questions should they arise  Gordy Clement RN Navigator Cardiac [place] [facility] [phone] office [phone] cell  Patient to take daily medications and is aware to arrive at 12:30pm.

## 2022-09-23 ENCOUNTER — Ambulatory Visit (HOSPITAL_COMMUNITY)
Admission: RE | Admit: 2022-09-23 | Discharge: 2022-09-23 | Disposition: A | Discharge status: Home / Self Care | Payer: Medicare Other | Source: Ambulatory Visit | Attending: Student | Admitting: Student

## 2022-09-23 ENCOUNTER — Other Ambulatory Visit: Payer: Self-pay | Admitting: Cardiology

## 2022-09-23 DIAGNOSIS — I441 Atrioventricular block, second degree: Secondary | ICD-10-CM | POA: Diagnosis not present

## 2022-09-23 DIAGNOSIS — E7800 Pure hypercholesterolemia, unspecified: Secondary | ICD-10-CM | POA: Diagnosis not present

## 2022-09-23 DIAGNOSIS — R0602 Shortness of breath: Secondary | ICD-10-CM | POA: Insufficient documentation

## 2022-09-23 DIAGNOSIS — I480 Paroxysmal atrial fibrillation: Secondary | ICD-10-CM | POA: Insufficient documentation

## 2022-09-23 DIAGNOSIS — R931 Abnormal findings on diagnostic imaging of heart and coronary circulation: Secondary | ICD-10-CM

## 2022-09-23 MED ORDER — NITROGLYCERIN 0.4 MG SL SUBL
0.8000 mg | SUBLINGUAL_TABLET | Freq: Once | SUBLINGUAL | Status: AC
  Administered 2022-09-23: 0.8 mg via SUBLINGUAL

## 2022-09-23 MED ORDER — NITROGLYCERIN 0.4 MG SL SUBL
SUBLINGUAL_TABLET | SUBLINGUAL | Status: AC
  Filled 2022-09-23: qty 2

## 2022-09-23 MED ORDER — IOHEXOL 350 MG/ML SOLN
95.0000 mL | Freq: Once | INTRAVENOUS | Status: AC | PRN
  Administered 2022-09-23: 95 mL via INTRAVENOUS

## 2022-09-24 ENCOUNTER — Ambulatory Visit (HOSPITAL_BASED_OUTPATIENT_CLINIC_OR_DEPARTMENT_OTHER)
Admission: RE | Admit: 2022-09-24 | Discharge: 2022-09-24 | Disposition: A | Discharge status: Home / Self Care | Payer: Medicare Other | Source: Ambulatory Visit | Attending: Cardiology | Admitting: Cardiology

## 2022-09-24 DIAGNOSIS — I480 Paroxysmal atrial fibrillation: Secondary | ICD-10-CM | POA: Diagnosis not present

## 2022-09-24 DIAGNOSIS — R0602 Shortness of breath: Secondary | ICD-10-CM | POA: Diagnosis not present

## 2022-09-24 DIAGNOSIS — R931 Abnormal findings on diagnostic imaging of heart and coronary circulation: Secondary | ICD-10-CM | POA: Diagnosis not present

## 2022-09-24 DIAGNOSIS — E7800 Pure hypercholesterolemia, unspecified: Secondary | ICD-10-CM | POA: Diagnosis not present

## 2022-09-24 DIAGNOSIS — I441 Atrioventricular block, second degree: Secondary | ICD-10-CM | POA: Diagnosis not present

## 2022-09-30 ENCOUNTER — Telehealth: Payer: Self-pay | Admitting: Cardiovascular Disease

## 2022-09-30 DIAGNOSIS — E7800 Pure hypercholesterolemia, unspecified: Secondary | ICD-10-CM

## 2022-09-30 DIAGNOSIS — Z79899 Other long term (current) drug therapy: Secondary | ICD-10-CM

## 2022-09-30 MED ORDER — ATORVASTATIN CALCIUM 40 MG PO TABS: 40.0000 mg | ORAL_TABLET | Freq: Every day | ORAL | 3 refills | Status: DC

## 2022-09-30 NOTE — Telephone Encounter (Signed)
-----  Message from Thayer Headings, MD sent at 09/30/2022  9:04 AM EST ----- I have spoken to Dr. Sonny Dandy.  He would like increase his Atorvastatin to 40 mg a day .  ( Hold off on adding Zetia at this point )  Check lipids and ALT in 2 months with an office visit several days later . We will discuss adding Zetia at that point .   Thanks   PN     ----- Message ----- From: Shirley Friar, Hershal Coria Sent: 09/19/2022   7:59 AM EST To: Thayer Headings, MD; Carylon Perches, [facility]  Labs look OK.   LDL <90 and LP(a) WNL. Will forward to Dr. Acie Fredrickson as Juluis Rainier

## 2022-09-30 NOTE — Telephone Encounter (Signed)
Spoke to patient and scheduled for OV and labs 11/18/21. Atorvastatin '40mg'$  sent to pharmacy on file.

## 2022-10-04 ENCOUNTER — Encounter: Payer: Self-pay | Admitting: Cardiovascular Disease

## 2022-10-08 NOTE — Telephone Encounter (Signed)
Patient is calling in to check on status of this message due to not hearing back. States a response can come via mychart or callback. Please advise.

## 2022-10-10 NOTE — Progress Notes (Signed)
Remote pacemaker transmission.   

## 2022-11-16 ENCOUNTER — Encounter: Payer: Self-pay | Admitting: Cardiovascular Disease

## 2022-11-16 NOTE — Progress Notes (Unsigned)
Cardiology Office Note   Date:  11/19/2022   ID:  Daniel Payne, DOB [birth year], MRN [id number]  PCP:  Kathalene Frames, MD  Cardiologist:   Mertie Moores, MD   Chief Complaint  Patient presents with   Atrial Fibrillation        1. Problem List:  1. Paroxysmal Atrial fib 2. Hyperlipidemia 3. Prostate surgery-status post robotic prostatectomy 4. Ulcerative colitis    Daniel Payne is a 77 y.o. 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  Has not fully controlled the bleeding. Best controlled by steroids.    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 He was cycling and went into rapid Afib with rate of 170.  He converted back to NSR in 3-4 hours. Had another episode of Afib while working in his garden.  Went to Dr.  Carlyle Lipa office . Afib was documeneted.  Was started on Dilt 30 QID.  Converted to NSR the next day He stopped the Dilt a week or so later.  Has developed episodes of sinus tachycardia recently.  Also associated with some chest tightness.  Does not last long.   This am he had very slight chest tightness while he was walking.  Lasted 5  Minutes.  No diaphoresis, no radiation.  Drinks wine with dinner.  02/08/2015:  Skin is doing well. He has paroxysmal atrial fibrillation. He has not wanted to start anticoagulation yet. He's had some problems with colitis. He had a stress Myoview study as part of his workup for some vague chest tightness. Myoview revealed no evidence of ischemia. He had normal left ventricle systolic function with an ejection fraction of 75%. Has been started on Humira for Ulcerative colits . Has been cycling quite a bit.  No issues.   No recurrent episodes of atrial fib .  He thinks it may have been related to the high dose prednisone   Sept. 7, 2016:  Daniel Payne is doing well. He stopped his Eliquis   Doing well.  Cycling regularly .  November 14, 2015:   Doing well.  Still cycling .    Just got back from [facility] for snow skiing .  [name], [place], )  Very active .  His UC is well controlled  November 26, 2016  Daniel Payne is doing well Spent the past 8 days skiing our in [place] 20-25 days a year now  Does well even at high altitude. Also hiked last year in [place] without any problems   Jan 06, 2018 :  Daniel Payne is seen today for follow-up of his atrial fibrillation and hyperlipidemia.  He has maintained normal sinus rhythm. Very active.  Skis quite a bit .  Walks 2 -3 miles with the dogs 4 days . Rides 15-20 miles on his bike once a week . No palpitations  Doing well on the Dilt LA 120 mg a day   June 30, 2018:  Daniel Payne is seen as a work in visit today.  He was recently on a trans -atlantic  flight and was awake for 26 hours.  He went into atrial for ablation.  He tried an extra half a diltiazem tablet but he remains in atrial fib .   Slept well but has not converted to NSR   He is planning on going up to his house in [place], [place] for the weekend.  Nov. 21, 2019  Daniel Payne is doing well,   He was seen for persistent AF on Oct. 23 . Started Eliquis He increased his Dilt to 120 BID, took an extra 30 mg of immediate release Dilt Converted to [name] a cold , went back into AF. Increased his Dilt again ( 120 BID + Dilt 30 ) converted back into NSR Continued Elquis for 1 week after wards  Feels great.   Is planning on going to [place] on Dec. 7 to ski.    Jan 10, 2020: Daniel Payne is seen today for follow-up visit.  He has a history of paroxysmal atrial fibrillation.  He has as needed Eliquis that he takes for a week after he has an episode of PAF. No further episodes of PAF after increasing his Diltiazem to 120  Mg CD BID .  Favorite ski spots  [place], [place]   Nov. 18, 2021: Daniel Payne is seen today for follow up of his  PAF., hx of HLD  He has also developed DOE while cycling   For the past 6 weeks, has had increasing fatigue.  Has trouble keeping up with his cycling  friends.  He has noted that his HR does not increase with exercise  O2 sats never drop below 98%.  No CP , pressure, burning, fullness   may need myoview or coronary Ct angio  Has PAF,  Has not had an episode since last year  - not since we increased his diltiazem to 120 CD  BID .  Dec. 3, 2021  Daniel Payne is seen as a work in visit today following his stress myoview.   He had poor chronotropic response with his treadmill.  HR was 70 initially and increased to 71 with ~2 minutes of exercise. We were asked to see him because of this poor chronotropic response.  Normally, Daniel Payne is a very active man.   He cycles ~15 - 20 miles with his cycling friends weekly but has not been able to keep up for the past month.   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   He normally takes Dilt 180 CD but had held this medication the day prior to and the day of the stress test.  The myoview revealed no ischemia He is scheduled to leave for [place] tomorrow AM to ski.  I reviewed the strips with Dr. Caryl Comes.   Our plan is to keep Daniel Payne off the Cardizem and repeat the GXT in a week to see if his chronotropic response improves.  Addendum  Dec. 5: [name] called last night from [place] and said he was flying back to [place].  Despite being off the Cardizem for 3 days , his HR remains slow and he is not able to ski at all.   He has requested that his GXT be scheduled for this upcoming week and to get an EP consult this week if possible.     Jan 18, 2021: Daniel Payne is seen . Hx of SSS. S/p pacer Because of his syncope, weakness, and pacer placement he missed most of the ski season last year. We have written a letter for a refund for his Epic pass.  Very active Rode his bike this week.  Got his HR  up to 160 Has not had atrial fib . 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 Offered to let us pick blueberries   He has been told not to use a chain saw or gas powered weed trimmer    April 01, 2022  Daniel Payne is seen for follow up of his PAF, SSS, pacer He has noticed that he has PAF when he exercises at an altitude of > 10,000 feet ( [place] )  [employer] and Eliquis if he goes into Afib  Has a pacer  Lovena Le)  He has an Afib burden 0.01%  We have discussed the role of Eliquis in prevention of stroke in Afib.  He understands the risk but feels very confident in taking this PRN  Very active Cycles, skis out [place] Is a retired Materials engineer    November 19, 2022 Daniel Payne is seen today for follow up of his PAF, SSS, pacer ( followed by Lovena Le)  Cor CTA Jan. 2024:   CAC score is 1168 ( 78th percentil for age / sex matched controls)  LAD :  mild- moderate  proximal plaque  LCx:   - minimal plaque  RCA:  mild - moderate plaque  He increased his atorva to 20  mg a day  Did not change his lipids much  Is ready to increase to Lipator 40   Had some DOE while cycling several weeks ago This seems to be improving      Past Medical History:  Diagnosis Date   A-fib [facility])    ADHD (attention deficit hyperactivity disorder)    Cancer ([place])    prostate dr. Chriss Czar davis   Chronic kidney disease    borderline stage III   Fissure, anal    Giardia    working in [place]   Hyperlipidemia    Infectious mononucleosis    Low back pain    Presbycusis of left ear    Spinal stenosis of lumbar region    Ulcerative colitis [facility])     Past Surgical History:  Procedure Laterality Date   ANAL FISSURE REPAIR     APPENDECTOMY     PACEMAKER IMPLANT N/A 09/10/2020   Procedure: PACEMAKER IMPLANT;  Surgeon: Evans Lance, MD;  Location: [facility];  Service: Cardiovascular;  Laterality: N/A;    PROSTATE SURGERY     ROTATOR CUFF REPAIR Left    VARICOCELE EXCISION Right      Current Outpatient Medications  Medication Sig Dispense Refill   acetaminophen (TYLENOL) 500 MG tablet Take 500 mg by mouth at bedtime.      apixaban (ELIQUIS) 5 MG TABS tablet Take 1 tablet (5 mg total) by mouth 2 (two) times daily. 180 tablet 3   atorvastatin (LIPITOR) 40 MG tablet Take 1 tablet (40 mg total) by mouth daily. 90 tablet 3   diltiazem (DILT-XR) 120 MG 24 hr capsule Take 1 capsule (120 mg total) by mouth every evening. 90 capsule 2   diltiazem (DILT-XR) 180 MG 24 hr capsule Take 1 capsule (180 mg total) by mouth every morning. 90 capsule 2   diphenhydrAMINE (BENADRYL) 25 MG tablet Take 25 mg by mouth at bedtime.      Melatonin 5 MG CAPS Take by mouth at bedtime.     Multiple Vitamins-Minerals (CENTRUM SILVER 50+MEN) TABS Take by mouth daily.     No current facility-administered medications for this visit.    Allergies:   Concerta [methylphenidate] and Lialda [  mesalamine]    Social History:  The patient  reports that he has never smoked. He has never used smokeless tobacco. He reports current alcohol use. He reports that he does not use drugs.   Family History:  The patient's family history includes Heart attack in his father; Stroke (age of onset: 8) in his mother.    ROS:  Noted in current history, otherwise review of systems is negative.  Physical Exam: Blood pressure 110/68, pulse 68, height '5\' 9"'$  (1.753 m), weight 153 lb 12.8 oz (69.8 kg), SpO2 98 %.       GEN:  Well nourished, well developed in no acute distress HEENT: Normal NECK: No JVD; No carotid bruits LYMPHATICS: No lymphadenopathy CARDIAC: RRR , no murmurs, rubs, gallops RESPIRATORY:  Clear to auscultation without rales, wheezing or rhonchi  ABDOMEN: Soft, non-tender, non-distended MUSCULOSKELETAL:  No edema; No deformity  SKIN: Warm and dry NEUROLOGIC:  Alert and oriented x 3    EKG:   November 19, 2022: A sensed  V paced rhythm at 68.  No ST or T wave changes.    Recent Labs: 09/18/2022: BUN 24; Creatinine, Ser 1.32; Hemoglobin 16.1; Platelets 248; Potassium 4.9; Sodium 138 11/18/2022: ALT 19    Lipid Panel    Component Value Date/Time   CHOL 172 11/18/2022 0801   TRIG 102 11/18/2022 0801   HDL 70 11/18/2022 0801   CHOLHDL 2.5 11/18/2022 0801   LDLCALC 84 11/18/2022 0801      Wt Readings from Last 3 Encounters:  11/19/22 153 lb 12.8 oz (69.8 kg)  09/17/22 153 lb 9.6 oz (69.7 kg)  08/22/22 152 lb 12.5 oz (69.3 kg)      Other studies Reviewed: Additional studies/ records that were reviewed today include: . Review of the above records demonstrates:    ASSESSMENT AND PLAN:  1.  Paroxysmal atrial fibrillation / SSS  S/p pacer       2.  Shortness of breath with exertion:   Shortness of breath seems to be improving as he cycles more.  I have asked him to let me know if he starts having worsening shortness of breath.     3.  Hyperlipidemia :    His LDL is 84.  I would like to see his LDL between 50 and 70.  Will increase his atorvastatin to 40 mg a day.  I suspect that he will eventually need also start Zetia.  He wants to increase his medicine stepwise.  Will recheck his lipids and ALT in 3 months.  I will see him a day or so after that to review the results.   Current medicines are reviewed at length with the patient today.  The patient does not have concerns regarding medicines.  The following changes have been made:      Disposition:       Signed, Mertie Moores, MD  11/19/2022 3:25 PM    [facility] [address], [place], [place]  [address] Phone: [phone]; Fax: [phone]

## 2022-11-18 ENCOUNTER — Ambulatory Visit: Payer: Medicare Other | Attending: Cardiovascular Disease

## 2022-11-18 DIAGNOSIS — Z79899 Other long term (current) drug therapy: Secondary | ICD-10-CM

## 2022-11-18 DIAGNOSIS — E7800 Pure hypercholesterolemia, unspecified: Secondary | ICD-10-CM | POA: Diagnosis not present

## 2022-11-18 LAB — LIPID PANEL
Chol/HDL Ratio: 2.5 ratio (ref 0.0–5.0)
Cholesterol, Total: 172 mg/dL (ref 100–199)
HDL: 70 mg/dL (ref >39)
LDL Chol Calc (NIH): 84 mg/dL (ref 0–99)
Triglycerides: 102 mg/dL (ref 0–149)
VLDL Cholesterol Cal: 18 mg/dL (ref 5–40)

## 2022-11-18 LAB — ALT: ALT: 19 IU/L (ref 0–44)

## 2022-11-19 ENCOUNTER — Other Ambulatory Visit: Payer: Medicare Other

## 2022-11-19 ENCOUNTER — Ambulatory Visit: Payer: Medicare Other | Attending: Cardiovascular Disease | Admitting: Cardiovascular Disease

## 2022-11-19 ENCOUNTER — Encounter: Payer: Self-pay | Admitting: Cardiovascular Disease

## 2022-11-19 DIAGNOSIS — Z79899 Other long term (current) drug therapy: Secondary | ICD-10-CM | POA: Diagnosis not present

## 2022-11-19 DIAGNOSIS — E7800 Pure hypercholesterolemia, unspecified: Secondary | ICD-10-CM

## 2022-11-19 NOTE — Patient Instructions (Signed)
Medication Instructions:  INCREASE Atorvastatin to '40mg'$  daily *If you need a refill on your cardiac medications before your next appointment, please call your pharmacy*   Lab Work: Lipids, ALT in 3 months If you have labs (blood work) drawn today and your tests are completely normal, you will receive your results only by: Kyle (if you have MyChart) OR A paper copy in the mail If you have any lab test that is abnormal or we need to change your treatment, we will call you to review the results.   Testing/Procedures: NONE   Follow-Up: At [facility], you and your health needs are our priority.  As part of our continuing mission to provide you with exceptional heart care, we have created designated Provider Care Teams.  These Care Teams include your primary Cardiologist (physician) and Advanced Practice Providers (APPs -  Physician Assistants and Nurse Practitioners) who all work together to provide you with the care you need, when you need it.  We recommend signing up for the patient portal called "MyChart".  Sign up information is provided on this After Visit Summary.  MyChart is used to connect with patients for Virtual Visits (Telemedicine).  Patients are able to view lab/test results, encounter notes, upcoming appointments, etc.  Non-urgent messages can be sent to your provider as well.   To learn more about what you can do with MyChart, go to [email or web address].    Your next appointment:   3 month(s)  Provider:   Mertie Moores, MD

## 2022-12-16 ENCOUNTER — Ambulatory Visit (INDEPENDENT_AMBULATORY_CARE_PROVIDER_SITE_OTHER): Payer: Medicare Other

## 2022-12-16 DIAGNOSIS — I441 Atrioventricular block, second degree: Secondary | ICD-10-CM | POA: Diagnosis not present

## 2022-12-16 DIAGNOSIS — H33322 Round hole, left eye: Secondary | ICD-10-CM | POA: Diagnosis not present

## 2022-12-16 DIAGNOSIS — H2513 Age-related nuclear cataract, bilateral: Secondary | ICD-10-CM | POA: Diagnosis not present

## 2022-12-16 LAB — CUP PACEART REMOTE DEVICE CHECK
Battery Remaining Longevity: 137 mo
Battery Voltage: 3.02 V
Brady Statistic AP VP Percent: 6.17 %
Brady Statistic AP VS Percent: 0 %
Brady Statistic AS VP Percent: 93.51 %
Brady Statistic AS VS Percent: 0.31 %
Brady Statistic RA Percent Paced: 6.3 %
Brady Statistic RV Percent Paced: 99.69 %
Date Time Interrogation Session: 20240409022001
Implantable Lead Connection Status: 753985
Implantable Lead Connection Status: 753985
Implantable Lead Implant Date: 20220103
Implantable Lead Implant Date: 20220103
Implantable Lead Location: 753859
Implantable Lead Location: 753860
Implantable Lead Model: 3830
Implantable Lead Model: 5076
Implantable Pulse Generator Implant Date: 20220103
Lead Channel Impedance Value: 361 Ohm
Lead Channel Impedance Value: 361 Ohm
Lead Channel Impedance Value: 418 Ohm
Lead Channel Impedance Value: 494 Ohm
Lead Channel Pacing Threshold Amplitude: 0.5 V
Lead Channel Pacing Threshold Amplitude: 0.75 V
Lead Channel Pacing Threshold Pulse Width: 0.4 ms
Lead Channel Pacing Threshold Pulse Width: 0.4 ms
Lead Channel Sensing Intrinsic Amplitude: 1.5 mV
Lead Channel Sensing Intrinsic Amplitude: 1.5 mV
Lead Channel Sensing Intrinsic Amplitude: 26.625 mV
Lead Channel Sensing Intrinsic Amplitude: 26.625 mV
Lead Channel Setting Pacing Amplitude: 1.5 V
Lead Channel Setting Pacing Amplitude: 2 V
Lead Channel Setting Pacing Pulse Width: 0.4 ms
Lead Channel Setting Sensing Sensitivity: 1.2 mV
Zone Setting Status: 755011

## 2023-01-22 DIAGNOSIS — Z23 Encounter for immunization: Secondary | ICD-10-CM | POA: Diagnosis not present

## 2023-01-23 NOTE — Progress Notes (Signed)
Remote pacemaker transmission.   

## 2023-02-04 DIAGNOSIS — Z23 Encounter for immunization: Secondary | ICD-10-CM | POA: Diagnosis not present

## 2023-02-04 DIAGNOSIS — Z950 Presence of cardiac pacemaker: Secondary | ICD-10-CM | POA: Diagnosis not present

## 2023-02-04 DIAGNOSIS — E7800 Pure hypercholesterolemia, unspecified: Secondary | ICD-10-CM | POA: Diagnosis not present

## 2023-02-04 DIAGNOSIS — Z0000 Encounter for general adult medical examination without abnormal findings: Secondary | ICD-10-CM | POA: Diagnosis not present

## 2023-02-04 DIAGNOSIS — Z79899 Other long term (current) drug therapy: Secondary | ICD-10-CM | POA: Diagnosis not present

## 2023-02-04 DIAGNOSIS — I700 Atherosclerosis of aorta: Secondary | ICD-10-CM | POA: Diagnosis not present

## 2023-02-04 DIAGNOSIS — J851 Abscess of lung with pneumonia: Secondary | ICD-10-CM | POA: Diagnosis not present

## 2023-02-04 DIAGNOSIS — I480 Paroxysmal atrial fibrillation: Secondary | ICD-10-CM | POA: Diagnosis not present

## 2023-02-04 DIAGNOSIS — K5190 Ulcerative colitis, unspecified, without complications: Secondary | ICD-10-CM | POA: Diagnosis not present

## 2023-02-04 DIAGNOSIS — I495 Sick sinus syndrome: Secondary | ICD-10-CM | POA: Diagnosis not present

## 2023-02-04 DIAGNOSIS — N189 Chronic kidney disease, unspecified: Secondary | ICD-10-CM | POA: Diagnosis not present

## 2023-02-04 DIAGNOSIS — Z8546 Personal history of malignant neoplasm of prostate: Secondary | ICD-10-CM | POA: Diagnosis not present

## 2023-02-20 ENCOUNTER — Other Ambulatory Visit: Payer: Medicare Other

## 2023-02-25 ENCOUNTER — Ambulatory Visit: Payer: Medicare Other | Attending: Cardiovascular Disease

## 2023-02-25 DIAGNOSIS — E7800 Pure hypercholesterolemia, unspecified: Secondary | ICD-10-CM

## 2023-02-25 DIAGNOSIS — Z79899 Other long term (current) drug therapy: Secondary | ICD-10-CM

## 2023-02-25 LAB — LIPID PANEL
Chol/HDL Ratio: 2.3 ratio (ref 0.0–5.0)
Cholesterol, Total: 168 mg/dL (ref 100–199)
HDL: 74 mg/dL (ref >39)
LDL Chol Calc (NIH): 80 mg/dL (ref 0–99)
Triglycerides: 75 mg/dL (ref 0–149)
VLDL Cholesterol Cal: 14 mg/dL (ref 5–40)

## 2023-02-25 LAB — ALT: ALT: 17 IU/L (ref 0–44)

## 2023-03-09 ENCOUNTER — Ambulatory Visit: Payer: Medicare Other | Admitting: Cardiovascular Disease

## 2023-03-09 NOTE — Progress Notes (Unsigned)
Cardiology Office Note   Date:  03/10/2023   ID:  Daniel Fish, MD, DOB [birth year], MRN [id number]  PCP:  Emilio Aspen, MD  Cardiologist:   Kristeen Miss, MD   Chief Complaint  Patient presents with   Atrial Fibrillation        1. Problem List:  1. Paroxysmal Atrial fib 2. Hyperlipidemia 3. Prostate surgery-status post robotic prostatectomy 4. 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  Has not fully controlled the bleeding. Best controlled by steroids.    He is a regular cyclist - rides with Shonna Chock regularly. He was cycling and went into rapid Afib with rate of 170.  He converted back to NSR in 3-4 hours. Had another episode of Afib while working in his garden.  Went to Dr.  Laverle Hobby office . Afib was documeneted.  Was started on Dilt 30 QID.  Converted to NSR the next day He stopped the Dilt a week or so later.  Has developed episodes of sinus tachycardia recently.  Also associated with some chest tightness.  Does not last long.   This am he had very slight chest tightness while he was walking.  Lasted 5  Minutes.  No diaphoresis, no radiation.  Drinks wine with dinner.  02/08/2015:  Skin is doing well. He has paroxysmal atrial fibrillation. He has not wanted to start anticoagulation yet. He's had some problems with colitis. He had a stress Myoview study as part of his workup for some vague chest tightness. Myoview revealed no evidence of ischemia. He had normal left ventricle systolic function with an ejection fraction of 75%. Has been started on Humira for Ulcerative colits . Has been cycling quite a bit.  No issues.   No recurrent episodes of atrial fib .  He thinks it may have been related to the high dose prednisone   Sept. 7, 2016:  Daniel Payne is doing well. He stopped his  Eliquis  Doing well.  Cycling regularly .  November 14, 2015:   Doing well.  Still cycling .    Just got back [facility] for snow skiing .  [place], [place], )  Very active .  His UC is well controlled  November 26, 2016  Daniel Payne is doing well Spent the past 8 days skiing our in [place] Skis 20-25 days a year now  Does well even at high altitude. Also hiked last year in [place] without any problems   Jan 06, 2018 :  Daniel Payne is seen today for follow-up of his atrial fibrillation and hyperlipidemia.  He has maintained normal sinus rhythm. Very active.  Skis quite a bit .  Walks 2 -3 miles with the dogs 4 days . Rides 15-20 miles on his bike once a week . No palpitations  Doing well on the Dilt LA 120 mg a day   June 30, 2018:  Daniel Payne is seen as a work in visit today.  He was recently on a trans -atlantic  flight and was awake for 26 hours.  He went into atrial for ablation.  He tried an extra half a diltiazem tablet but he remains in atrial fib .   Slept well but has not converted to NSR   He is planning on going [place].  Nov. 21, 2019  Daniel Payne is doing well,   He was seen for persistent AF on Oct. 23 . Started Eliquis He increased his Dilt to 120 BID, took an extra 30 mg of immediate release Dilt Converted to [employer] a cold , went back into AF. Increased his Dilt again ( 120 BID + Dilt 30 ) converted back into NSR Continued Elquis for 1 week after wards  Feels great.   Is planning on going to [place] on Dec. 7 to ski.    Jan 10, 2020: Daniel Payne is seen today for follow-up visit.  He has a history of paroxysmal atrial fibrillation.  He has as needed Eliquis that he takes for a week after he has an episode of PAF. No further episodes of PAF after increasing his Diltiazem to 120  Mg CD BID .  Favorite ski spots  [place], [place],  [place]   Nov. 18, 2021: Daniel Payne is seen today for follow up of  his PAF., hx of HLD  He has also developed DOE while cycling   For the past 6 weeks, has had increasing fatigue.  Has trouble keeping up with his cycling  friends.  He has noted that his HR does not increase with exercise  O2 sats never drop below 98%.  No CP , pressure, burning, fullness   may need myoview or coronary Ct angio  Has PAF,  Has not had an episode since last year  - not since we increased his diltiazem to 120 CD  BID .  Dec. 3, 2021  Daniel Payne is seen as a work in visit today following his stress myoview.   He had poor chronotropic response with his treadmill.  HR was 70 initially and increased to 71 with ~2 minutes of exercise. We were asked to see him because of this poor chronotropic response.  Normally, Daniel Payne is a very active man.   He cycles ~15 - 20 miles with his cycling friends weekly but has not been able to keep up for the past month.   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   He normally takes Dilt 180 CD but had held this medication the day prior to and the day of the stress test.  The myoview revealed no ischemia He is scheduled to leave for [place] tomorrow AM to ski.  I reviewed the strips with Dr. Graciela Husbands.   Our plan is to keep Daniel Payne off the Cardizem and repeat the GXT in a week to see if his chronotropic response improves.  Addendum  Dec. 5: [name] called last night from [place] and said he was flying back to [place].  Despite being off the Cardizem for 3 days , his HR remains slow and he is not able to ski at all.   He has requested that his GXT be scheduled for this upcoming week and to get an EP consult this week if possible.     Jan 18, 2021: Daniel Payne is seen . Hx of SSS. S/p pacer Because of his syncope, weakness, and pacer placement he missed most of the ski season last year. We have written a letter for a refund for his Epic pass.  Very active Rode his bike this week.  Got  his HR up to 160 Has not had atrial fib . 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 Offered to let us pick blueberries   He has been told not to use a chain saw or gas powered weed trimmer    April 01, 2022  Daniel Payne is seen for follow up of his PAF, SSS, pacer He has noticed that he has PAF when he exercises at an altitude of > 10,000 feet ( [place] )  [employer] and Eliquis if he goes into Afib  Has a pacer  Ladona Ridgel)  He has an Afib burden 0.01%  We have discussed the role of Eliquis in prevention of stroke in Afib.  He understands the risk but feels very confident in taking this PRN  Very active Cycles, skis [place] Is a retired Best boy    November 19, 2022 Daniel Payne is seen today for follow up of his PAF, SSS, pacer ( followed by Ladona Ridgel)  Cor CTA Jan. 2024:   CAC score is 1168 ( 78th percentil for age / sex matched controls)  LAD :  mild- moderate  proximal plaque  LCx:   - minimal plaque  RCA:  mild - moderate plaque  He increased his atorva to 20  mg a day  Did not change his lipids much  Is ready to increase to Lipator 40     Had some DOE while cycling several weeks ago This seems to be improving    March 10, 2023: Daniel Payne is seen today for follow-up of his paroxysmal atrial fibrillation, sick sinus syndrome, pacemaker.  He brought an article from the [place] Journal of Medicine entitled "Apixaban for stroke prevention in subclinical atrial fibrillation" the article was a double-blind study in patients with subclinical atrial fibrillation.  They tested apixaban versus aspirin.      Apixaban    ASA Risk of stroke   0.78% per patient-year  1.24% per patient-year Major Bleeding  1.71%     0.94% Fatal bleeding  5 patients     8 patients    Is now having some muscle aches / pains while cycling   This started when he increased his atorvastatin   Anticipate  referring him to the lipid clinic for consideration of a PCSK9 inhibitor ( Repatha or Pralulent )     He is on Eliquis 5 BID  He holds his eliquis for 10 days  for each week long  [place] trip [place] . No bleeding episodes     Past Medical History:  Diagnosis Date   A-fib [facility])    ADHD (attention deficit hyperactivity disorder)    Cancer (HCC)    prostate dr. Ferne Reus davis   Chronic kidney disease    borderline stage III   Fissure, anal    Giardia    working in [place]   Hyperlipidemia    Infectious mononucleosis    Low back pain    Presbycusis of left ear    Spinal stenosis of lumbar region    Ulcerative colitis [facility])     Past Surgical History:  Procedure Laterality Date   ANAL FISSURE REPAIR     APPENDECTOMY     PACEMAKER IMPLANT N/A 09/10/2020   Procedure: PACEMAKER IMPLANT;  Surgeon: Marinus Maw, MD;  Location: [facility] INVASIVE CV LAB;  Service: Cardiovascular;  Laterality: N/A;   PROSTATE SURGERY     ROTATOR CUFF REPAIR Left    VARICOCELE EXCISION Right  Current Outpatient Medications  Medication Sig Dispense Refill   acetaminophen (TYLENOL) 500 MG tablet Take 500 mg by mouth at bedtime.      apixaban (ELIQUIS) 5 MG TABS tablet Take 1 tablet (5 mg total) by mouth 2 (two) times daily. 180 tablet 3   atorvastatin (LIPITOR) 40 MG tablet Take 1 tablet (40 mg total) by mouth daily. 90 tablet 3   diltiazem (DILT-XR) 120 MG 24 hr capsule Take 1 capsule (120 mg total) by mouth every evening. 90 capsule 2   diltiazem (DILT-XR) 180 MG 24 hr capsule Take 1 capsule (180 mg total) by mouth every morning. 90 capsule 2   diphenhydrAMINE (BENADRYL) 25 MG tablet Take 25 mg by mouth at bedtime.      Melatonin 5 MG CAPS Take by mouth at bedtime.     Multiple Vitamins-Minerals (CENTRUM SILVER 50+MEN) TABS Take by mouth daily.     No current facility-administered medications for this visit.    Allergies:   Concerta [methylphenidate] and Lialda [mesalamine]    Social History:  The  patient  reports that he has never smoked. He has never used smokeless tobacco. He reports current alcohol use. He reports that he does not use drugs.   Family History:  The patient's family history includes Heart attack in his father; Stroke (age of onset: 35) in his mother.    ROS:  Noted in current history, otherwise review of systems is negative.  Physical Exam: Blood pressure 138/88, pulse 65, height 5\' 9"  (1.753 m), weight 150 lb 3.2 oz (68.1 kg), SpO2 99 %.       GEN:  Well nourished, well developed in no acute distress HEENT: Normal NECK: No JVD; No carotid bruits LYMPHATICS: No lymphadenopathy CARDIAC: RRR , sfot systolic murmur  RESPIRATORY:  Clear to auscultation without rales, wheezing or rhonchi  ABDOMEN: Soft, non-tender, non-distended MUSCULOSKELETAL:  No edema; No deformity  SKIN: Warm and dry NEUROLOGIC:  Alert and oriented x 3    EKG:        Recent Labs: 09/18/2022: BUN 24; Creatinine, Ser 1.32; Hemoglobin 16.1; Platelets 248; Potassium 4.9; Sodium 138 02/25/2023: ALT 17    Lipid Panel    Component Value Date/Time   CHOL 168 02/25/2023 0829   TRIG 75 02/25/2023 0829   HDL 74 02/25/2023 0829   CHOLHDL 2.3 02/25/2023 0829   LDLCALC 80 02/25/2023 0829      Wt Readings from Last 3 Encounters:  03/10/23 150 lb 3.2 oz (68.1 kg)  11/19/22 153 lb 12.8 oz (69.8 kg)  09/17/22 153 lb 9.6 oz (69.7 kg)      Other studies Reviewed: Additional studies/ records that were reviewed today include: . Review of the above records demonstrates:    ASSESSMENT AND PLAN:  1.  Paroxysmal atrial fibrillation / SSS  S/p pacer  He is on Eliquis.  He seems to be tolerating it well.  When he goes [place] west, he will hold the Eliquis for 3 days before his ski trip and then restart it as soon as he gets home.  This seems to be working well for him.  He takes his Eliquis with him and then will start Eliquis immediately if he goes into atrial fibrillation during  the ski trip.  This has happened in the past.  We discussed the very low risk associated with this interruption of Eliquis.  We both agree that this seems to work well  2.  Hyperlipidemia :     He has been on atorvastatin.  His last LDL is 80.  I would like to see his LDL less than 70.  He seems to be developing some muscle aches and pains on the higher dose of atorvastatin.  Will refer him to our lipid clinic for consideration of a PCSK9 inhibitor ( pralulent or Repatha,) or perhaps Inclisiran .      Current medicines are reviewed at length with the patient today.  The patient does not have concerns regarding medicines.  The following changes have been made:      Disposition:    6 months    Signed, Kristeen Miss, MD  03/10/2023 5:12 PM    [facility] [address] [place], [place], [place]  [address] Phone: [phone]; Fax: [phone]

## 2023-03-10 ENCOUNTER — Encounter: Payer: Self-pay | Admitting: Cardiovascular Disease

## 2023-03-10 ENCOUNTER — Ambulatory Visit: Payer: Medicare Other | Attending: Cardiovascular Disease | Admitting: Cardiovascular Disease

## 2023-03-10 VITALS — BP 138/88 | HR 65 | Ht 69.0 in | Wt 150.2 lb

## 2023-03-10 DIAGNOSIS — E782 Mixed hyperlipidemia: Secondary | ICD-10-CM

## 2023-03-10 DIAGNOSIS — I480 Paroxysmal atrial fibrillation: Secondary | ICD-10-CM

## 2023-03-10 NOTE — Patient Instructions (Signed)
Medication Instructions:  Your physician recommends that you continue on your current medications as directed. Please refer to the Current Medication list given to you today. *If you need a refill on your cardiac medications before your next appointment, please call your pharmacy*   Follow-Up: At [facility], you and your health needs are our priority.  As part of our continuing mission to provide you with exceptional heart care, we have created designated Provider Care Teams.  These Care Teams include your primary Cardiologist (physician) and Advanced Practice Providers (APPs -  Physician Assistants and Nurse Practitioners) who all work together to provide you with the care you need, when you need it.  We recommend signing up for the patient portal called "MyChart".  Sign up information is provided on this After Visit Summary.  MyChart is used to connect with patients for Virtual Visits (Telemedicine).  Patients are able to view lab/test results, encounter notes, upcoming appointments, etc.  Non-urgent messages can be sent to your provider as well.   To learn more about what you can do with MyChart, go to [email or web address].    Your next appointment:  Referral placed for lipid clinic 6 month(s)  Provider:   Kristeen Miss, MD

## 2023-03-17 ENCOUNTER — Ambulatory Visit (INDEPENDENT_AMBULATORY_CARE_PROVIDER_SITE_OTHER): Payer: Medicare Other

## 2023-03-17 DIAGNOSIS — I441 Atrioventricular block, second degree: Secondary | ICD-10-CM | POA: Diagnosis not present

## 2023-03-18 LAB — CUP PACEART REMOTE DEVICE CHECK
Battery Remaining Longevity: 133 mo
Battery Voltage: 3.02 V
Brady Statistic AP VP Percent: 10.42 %
Brady Statistic AP VS Percent: 0 %
Brady Statistic AS VP Percent: 88.71 %
Brady Statistic AS VS Percent: 0.87 %
Brady Statistic RA Percent Paced: 10.56 %
Brady Statistic RV Percent Paced: 99.13 %
Date Time Interrogation Session: 20240708234343
Implantable Lead Connection Status: 753985
Implantable Lead Connection Status: 753985
Implantable Lead Implant Date: 20220103
Implantable Lead Implant Date: 20220103
Implantable Lead Location: 753859
Implantable Lead Location: 753860
Implantable Lead Model: 3830
Implantable Lead Model: 5076
Implantable Pulse Generator Implant Date: 20220103
Lead Channel Impedance Value: 342 Ohm
Lead Channel Impedance Value: 361 Ohm
Lead Channel Impedance Value: 418 Ohm
Lead Channel Impedance Value: 494 Ohm
Lead Channel Pacing Threshold Amplitude: 0.375 V
Lead Channel Pacing Threshold Amplitude: 0.75 V
Lead Channel Pacing Threshold Pulse Width: 0.4 ms
Lead Channel Pacing Threshold Pulse Width: 0.4 ms
Lead Channel Sensing Intrinsic Amplitude: 1 mV
Lead Channel Sensing Intrinsic Amplitude: 1 mV
Lead Channel Sensing Intrinsic Amplitude: 21.5 mV
Lead Channel Sensing Intrinsic Amplitude: 21.5 mV
Lead Channel Setting Pacing Amplitude: 1.5 V
Lead Channel Setting Pacing Amplitude: 2 V
Lead Channel Setting Pacing Pulse Width: 0.4 ms
Lead Channel Setting Sensing Sensitivity: 1.2 mV
Zone Setting Status: 755011

## 2023-04-01 ENCOUNTER — Ambulatory Visit: Payer: Medicare Other | Attending: Internal Medicine | Admitting: Pharmacist

## 2023-04-01 DIAGNOSIS — E785 Hyperlipidemia, unspecified: Secondary | ICD-10-CM | POA: Insufficient documentation

## 2023-04-01 NOTE — Patient Instructions (Addendum)
Continue taking atorvastatin 20mg  daily  I will submit a prior authorization for Repatha. I will call you once I hear back. Please call me at [phone] with any questions.   Repatha is a cholesterol medication that improved your body's ability to get rid of "bad cholesterol" known as LDL. It can lower your LDL up to 60%! It is an injection that is given under the skin every 2 weeks. The medication often requires a prior authorization from your insurance company. We will take care of submitting all the necessary information to your insurance company to get it approved. The most common side effects of Repatha include runny nose, symptoms of the common cold, rarely flu or flu-like symptoms, back/muscle pain in about 3-4% of the patients, and redness, pain, or bruising at the injection site. Tell your healthcare provider if you have any side effect that bothers you or that does not go away.

## 2023-04-01 NOTE — Assessment & Plan Note (Signed)
Assessment: LDL-C is above goal of <70 due to CAC score Patient is tolerating atorvastatin 20mg  well He is very active I did recommend adding in some resistance/strength training We discussed why his goal is <70 Reviewed options with patient which included adding Repatha vs stay on lower dose statin and adding zetia vs leqvio vs changing to rosuvastatin.  Plan: Will submit PA for Repatha Continue atorvastatin 20mg  daily Recheck labs in 2-3 months after starting Repatha

## 2023-04-01 NOTE — Progress Notes (Signed)
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 Elease Hashimoto. PMH is significant for afib, HLD, SSS s/p pacemaker and UC . Coronary CTA was done in Jan 2024 with a score of 1168 (78th percentile). LP(a) <8.4 Patient developed muscle pains when atorvastatin dose was increased. Referred to lipid clinic.  Patient presents today to clinic. He is a retired Research officer, trade [employer], now working as a hospice MD (since 2014). He has done his research on PCSK9i.  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 Rides his bike 12-20 miles twice per week. Walks 1.5-2 miles daily. Also plays golf and gardens. No resistance training.  Last LDL-C was 80 on atorvastatin 40mg . He had more muscle aches on atorvastatin 40mg . Did well on 10mg . Currently taking 20mg  (cutting 40mg  in half as not to waste them). Plans to decrease to 10mg  once he is finished with the 40mg  tablets.   Reviewed options with patient which included adding Repatha vs stay on lower dose statin and adding zetia vs leqvio vs changing to rosuvastatin.  Current Medications: atorvastatin 20mg  Intolerances: atorvastatin 40mg   Risk Factors: age, CAC LDL-C goal: <70 ApoB goal:   Exercise: walks 1.5 miles daily, rides bike 12-20 miles twice a week, golf a few days a week, gardens  Family History:  Family History  Problem Relation Age of Onset   Stroke Mother 55   Heart attack Father    Social History: no tobacco, 1-2 glasses on wine per night  Labs: Lipid Panel     Component Value Date/Time   CHOL 168 02/25/2023 0829   TRIG 75 02/25/2023 0829   HDL 74 02/25/2023 0829   CHOLHDL 2.3 02/25/2023 0829   LDLCALC 80 02/25/2023 0829   LABVLDL 14 02/25/2023 0829    Past Medical History:  Diagnosis Date   A-fib (HCC)    ADHD (attention deficit hyperactivity disorder)    Cancer (HCC)    prostate dr. Ferne Reus davis   Chronic kidney disease     borderline stage III   Fissure, anal    Giardia    working in [place]   Hyperlipidemia    Infectious mononucleosis    Low back pain    Presbycusis of left ear    Spinal stenosis of lumbar region    Ulcerative colitis (HCC)     Current Outpatient Medications on File Prior to Visit  Medication Sig Dispense Refill   atorvastatin (LIPITOR) 20 MG tablet Take 1 tablet (20 mg total) by mouth daily.     acetaminophen (TYLENOL) 500 MG tablet Take 500 mg by mouth at bedtime.      apixaban (ELIQUIS) 5 MG TABS tablet Take 1 tablet (5 mg total) by mouth 2 (two) times daily. 180 tablet 3   diltiazem (DILT-XR) 120 MG 24 hr capsule Take 1 capsule (120 mg total) by mouth every evening. 90 capsule 2   diltiazem (DILT-XR) 180 MG 24 hr capsule Take 1 capsule (180 mg total) by mouth every morning. 90 capsule 2   diphenhydrAMINE (BENADRYL) 25 MG tablet Take 25 mg by mouth at bedtime.      Melatonin 5 MG CAPS Take by mouth at bedtime.     Multiple Vitamins-Minerals (  CENTRUM SILVER 50+MEN) TABS Take by mouth daily.     No current facility-administered medications on file prior to visit.    Allergies  Allergen Reactions   Concerta [Methylphenidate] Other (See Comments)    Burning mouth   Lialda [Mesalamine] Diarrhea    Assessment/Plan:  1. Hyperlipidemia -  Hyperlipidemia Assessment: LDL-C is above goal of <70 due to CAC score Patient is tolerating atorvastatin 20mg  well He is very active I did recommend adding in some resistance/strength training We discussed why his goal is <70 Reviewed options with patient which included adding Repatha vs stay on lower dose statin and adding zetia vs leqvio vs changing to rosuvastatin.  Plan: Will submit PA for Repatha Continue atorvastatin 20mg  daily Recheck labs in 2-3 months after starting Repatha   Thank you,  Olene Floss, Pharm.D, BCACP, BCPS, CPP  [facility] [address],  [place], [place] [address]  Phone: [phone]; Fax: [phone]

## 2023-04-02 ENCOUNTER — Other Ambulatory Visit (HOSPITAL_COMMUNITY): Payer: Self-pay

## 2023-04-02 ENCOUNTER — Telehealth: Payer: Self-pay

## 2023-04-02 DIAGNOSIS — E785 Hyperlipidemia, unspecified: Secondary | ICD-10-CM

## 2023-04-02 NOTE — Telephone Encounter (Signed)
Pharmacy Patient Advocate Encounter   Received notification from Physician's Office/PHARMD [facility]IA  that prior authorization for REPATHA is required/requested.   Insurance verification completed.   The patient is insured through [facility] .   Per test claim: PA required; PA submitted to [facility] via CoverMyMeds Key/confirmation #/EOC [id number]    Status is pending

## 2023-04-03 MED ORDER — REPATHA SURECLICK 140 MG/ML ~~LOC~~ SOAJ: 1.0000 mL | SUBCUTANEOUS | 11 refills | Status: DC

## 2023-04-03 NOTE — Telephone Encounter (Signed)
Received voicemail from [employer] that Repatha has been approved through 04/02/24.

## 2023-04-03 NOTE — Telephone Encounter (Signed)
Patient made aware of approval. Rx sent to pharmacy. Pt scheduled for labs 10/2

## 2023-04-03 NOTE — Telephone Encounter (Signed)
Pharmacy Patient Advocate Encounter  Received notification from [facility] that Prior Authorization for REPATHA has been  Approved until 7.26.25

## 2023-04-09 NOTE — Progress Notes (Signed)
Remote pacemaker transmission.   

## 2023-05-27 DIAGNOSIS — Z23 Encounter for immunization: Secondary | ICD-10-CM | POA: Diagnosis not present

## 2023-06-09 ENCOUNTER — Other Ambulatory Visit: Payer: Self-pay | Admitting: Cardiovascular Disease

## 2023-06-10 ENCOUNTER — Ambulatory Visit: Payer: Medicare Other | Attending: Cardiovascular Disease

## 2023-06-10 DIAGNOSIS — E785 Hyperlipidemia, unspecified: Secondary | ICD-10-CM | POA: Diagnosis not present

## 2023-06-11 LAB — LIPID PANEL
Chol/HDL Ratio: 1.6 {ratio} (ref 0.0–5.0)
Cholesterol, Total: 130 mg/dL (ref 100–199)
HDL: 81 mg/dL (ref >39)
LDL Chol Calc (NIH): 34 mg/dL (ref 0–99)
Triglycerides: 78 mg/dL (ref 0–149)
VLDL Cholesterol Cal: 15 mg/dL (ref 5–40)

## 2023-06-16 ENCOUNTER — Ambulatory Visit: Payer: Medicare Other

## 2023-06-16 DIAGNOSIS — I441 Atrioventricular block, second degree: Secondary | ICD-10-CM | POA: Diagnosis not present

## 2023-06-16 LAB — CUP PACEART REMOTE DEVICE CHECK
Battery Remaining Longevity: 128 mo
Battery Voltage: 3.02 V
Brady Statistic AP VP Percent: 8.3 %
Brady Statistic AP VS Percent: 0 %
Brady Statistic AS VP Percent: 91.58 %
Brady Statistic AS VS Percent: 0.1 %
Brady Statistic RA Percent Paced: 8.17 %
Brady Statistic RV Percent Paced: 99.89 %
Date Time Interrogation Session: 20241008060834
Implantable Lead Connection Status: 753985
Implantable Lead Connection Status: 753985
Implantable Lead Implant Date: 20220103
Implantable Lead Implant Date: 20220103
Implantable Lead Location: 753859
Implantable Lead Location: 753860
Implantable Lead Model: 3830
Implantable Lead Model: 5076
Implantable Pulse Generator Implant Date: 20220103
Lead Channel Impedance Value: 342 Ohm
Lead Channel Impedance Value: 361 Ohm
Lead Channel Impedance Value: 399 Ohm
Lead Channel Impedance Value: 475 Ohm
Lead Channel Pacing Threshold Amplitude: 0.375 V
Lead Channel Pacing Threshold Amplitude: 0.75 V
Lead Channel Pacing Threshold Pulse Width: 0.4 ms
Lead Channel Pacing Threshold Pulse Width: 0.4 ms
Lead Channel Sensing Intrinsic Amplitude: 1.5 mV
Lead Channel Sensing Intrinsic Amplitude: 1.5 mV
Lead Channel Sensing Intrinsic Amplitude: 22.25 mV
Lead Channel Sensing Intrinsic Amplitude: 22.25 mV
Lead Channel Setting Pacing Amplitude: 1.5 V
Lead Channel Setting Pacing Amplitude: 2 V
Lead Channel Setting Pacing Pulse Width: 0.4 ms
Lead Channel Setting Sensing Sensitivity: 1.2 mV
Zone Setting Status: 755011

## 2023-07-02 NOTE — Progress Notes (Signed)
Remote pacemaker transmission.   

## 2023-07-15 ENCOUNTER — Other Ambulatory Visit: Payer: Self-pay | Admitting: Medical Genetics

## 2023-07-15 DIAGNOSIS — Z006 Encounter for examination for normal comparison and control in clinical research program: Secondary | ICD-10-CM

## 2023-08-05 DIAGNOSIS — E7800 Pure hypercholesterolemia, unspecified: Secondary | ICD-10-CM | POA: Diagnosis not present

## 2023-08-05 DIAGNOSIS — Z8546 Personal history of malignant neoplasm of prostate: Secondary | ICD-10-CM | POA: Diagnosis not present

## 2023-08-05 DIAGNOSIS — Z0000 Encounter for general adult medical examination without abnormal findings: Secondary | ICD-10-CM | POA: Diagnosis not present

## 2023-08-05 DIAGNOSIS — Z79899 Other long term (current) drug therapy: Secondary | ICD-10-CM | POA: Diagnosis not present

## 2023-08-05 DIAGNOSIS — I700 Atherosclerosis of aorta: Secondary | ICD-10-CM | POA: Diagnosis not present

## 2023-08-05 DIAGNOSIS — I480 Paroxysmal atrial fibrillation: Secondary | ICD-10-CM | POA: Diagnosis not present

## 2023-08-05 DIAGNOSIS — I495 Sick sinus syndrome: Secondary | ICD-10-CM | POA: Diagnosis not present

## 2023-08-05 DIAGNOSIS — Z950 Presence of cardiac pacemaker: Secondary | ICD-10-CM | POA: Diagnosis not present

## 2023-08-05 DIAGNOSIS — J851 Abscess of lung with pneumonia: Secondary | ICD-10-CM | POA: Diagnosis not present

## 2023-08-05 DIAGNOSIS — M4807 Spinal stenosis, lumbosacral region: Secondary | ICD-10-CM | POA: Diagnosis not present

## 2023-08-05 DIAGNOSIS — N189 Chronic kidney disease, unspecified: Secondary | ICD-10-CM | POA: Diagnosis not present

## 2023-08-05 DIAGNOSIS — E559 Vitamin D deficiency, unspecified: Secondary | ICD-10-CM | POA: Diagnosis not present

## 2023-08-05 DIAGNOSIS — K5190 Ulcerative colitis, unspecified, without complications: Secondary | ICD-10-CM | POA: Diagnosis not present

## 2023-08-07 ENCOUNTER — Other Ambulatory Visit (HOSPITAL_COMMUNITY)
Admission: RE | Admit: 2023-08-07 | Discharge: 2023-08-07 | Disposition: A | Discharge status: Home / Self Care | Payer: Medicare Other | Source: Ambulatory Visit | Attending: Oncology | Admitting: Oncology

## 2023-08-07 DIAGNOSIS — Z006 Encounter for examination for normal comparison and control in clinical research program: Secondary | ICD-10-CM | POA: Insufficient documentation

## 2023-08-18 LAB — GENECONNECT MOLECULAR SCREEN: Genetic Analysis Overall Interpretation: NEGATIVE

## 2023-08-19 ENCOUNTER — Other Ambulatory Visit: Payer: Self-pay | Admitting: Cardiovascular Disease

## 2023-08-25 ENCOUNTER — Telehealth: Payer: Self-pay | Admitting: Cardiovascular Disease

## 2023-08-25 NOTE — Telephone Encounter (Signed)
Pt c/o medication issue:  1. Name of Medication:   diltiazem (CARDIZEM) 30 MG tablet   2. How are you currently taking this medication (dosage and times per day)?   3. Are you having a reaction (difficulty breathing--STAT)?   4. What is your medication issue?   Patient wants to get new prescription for this medication to read "breakthrough medication".  Patient stated he is going skiing in high altitude and will need a prescription for 20 tablets with one refill sent to [id number] - [place], [place] - [address] AT [facility] OF [address].

## 2023-08-26 DIAGNOSIS — R748 Abnormal levels of other serum enzymes: Secondary | ICD-10-CM | POA: Diagnosis not present

## 2023-08-26 DIAGNOSIS — R7989 Other specified abnormal findings of blood chemistry: Secondary | ICD-10-CM | POA: Diagnosis not present

## 2023-08-27 ENCOUNTER — Other Ambulatory Visit: Payer: Self-pay | Admitting: *Deleted

## 2023-08-27 MED ORDER — DILTIAZEM HCL 30 MG PO TABS: 30.0000 mg | ORAL_TABLET | Freq: Four times a day (QID) | ORAL | 1 refills | Status: DC | PRN

## 2023-08-27 NOTE — Telephone Encounter (Signed)
  Pt is calling back to follow up, he said, he is leaving on Saturday and he needs this refill today. He is asking for Dr. Elease Hashimoto to call him back today as soon as possible

## 2023-08-27 NOTE — Telephone Encounter (Signed)
Spoke to [employer].  Pt aware Rx sent in to requested pharmacy

## 2023-09-15 ENCOUNTER — Ambulatory Visit (INDEPENDENT_AMBULATORY_CARE_PROVIDER_SITE_OTHER): Payer: Medicare Other

## 2023-09-15 DIAGNOSIS — I441 Atrioventricular block, second degree: Secondary | ICD-10-CM

## 2023-09-16 LAB — CUP PACEART REMOTE DEVICE CHECK
Battery Remaining Longevity: 126 mo
Battery Voltage: 3.02 V
Brady Statistic AP VP Percent: 8.07 %
Brady Statistic AP VS Percent: 0 %
Brady Statistic AS VP Percent: 91.76 %
Brady Statistic AS VS Percent: 0.17 %
Brady Statistic RA Percent Paced: 8.2 %
Brady Statistic RV Percent Paced: 99.82 %
Date Time Interrogation Session: 20250106185242
Implantable Lead Connection Status: 753985
Implantable Lead Connection Status: 753985
Implantable Lead Implant Date: 20220103
Implantable Lead Implant Date: 20220103
Implantable Lead Location: 753859
Implantable Lead Location: 753860
Implantable Lead Model: 3830
Implantable Lead Model: 5076
Implantable Pulse Generator Implant Date: 20220103
Lead Channel Impedance Value: 380 Ohm
Lead Channel Impedance Value: 380 Ohm
Lead Channel Impedance Value: 418 Ohm
Lead Channel Impedance Value: 513 Ohm
Lead Channel Pacing Threshold Amplitude: 0.375 V
Lead Channel Pacing Threshold Amplitude: 0.75 V
Lead Channel Pacing Threshold Pulse Width: 0.4 ms
Lead Channel Pacing Threshold Pulse Width: 0.4 ms
Lead Channel Sensing Intrinsic Amplitude: 0.875 mV
Lead Channel Sensing Intrinsic Amplitude: 0.875 mV
Lead Channel Sensing Intrinsic Amplitude: 25 mV
Lead Channel Sensing Intrinsic Amplitude: 25 mV
Lead Channel Setting Pacing Amplitude: 1.5 V
Lead Channel Setting Pacing Amplitude: 2 V
Lead Channel Setting Pacing Pulse Width: 0.4 ms
Lead Channel Setting Sensing Sensitivity: 1.2 mV
Zone Setting Status: 755011

## 2023-09-22 ENCOUNTER — Ambulatory Visit: Payer: Medicare Other | Admitting: Cardiovascular Disease

## 2023-10-02 ENCOUNTER — Ambulatory Visit: Payer: Medicare Other | Admitting: Cardiovascular Disease

## 2023-10-05 ENCOUNTER — Ambulatory Visit: Payer: Medicare Other | Attending: Cardiovascular Disease | Admitting: Cardiovascular Disease

## 2023-10-05 ENCOUNTER — Encounter: Payer: Self-pay | Admitting: Cardiovascular Disease

## 2023-10-05 VITALS — BP 128/70 | HR 63 | Ht 69.0 in | Wt 155.2 lb

## 2023-10-05 DIAGNOSIS — I480 Paroxysmal atrial fibrillation: Secondary | ICD-10-CM

## 2023-10-05 DIAGNOSIS — E785 Hyperlipidemia, unspecified: Secondary | ICD-10-CM

## 2023-10-05 NOTE — Patient Instructions (Signed)
Follow-Up: At [facility], you and your health needs are our priority.  As part of our continuing mission to provide you with exceptional heart care, we have created designated Provider Care Teams.  These Care Teams include your primary Cardiologist (physician) and Advanced Practice Providers (APPs -  Physician Assistants and Nurse Practitioners) who all work together to provide you with the care you need, when you need it.  Your next appointment:   6 month(s)  Provider:   Thurmon Fair, MD     1st Floor: - Lobby - Registration  - Pharmacy  - Lab - Cafe  2nd Floor: - PV Lab - Diagnostic Testing (echo, CT, nuclear med)  3rd Floor: - Vacant  4th Floor: - TCTS (cardiothoracic surgery) - AFib Clinic - Structural Heart Clinic - Vascular Surgery  - Vascular Ultrasound  5th Floor: - HeartCare Cardiology (general and EP) - Clinical Pharmacy for coumadin, hypertension, lipid, weight-loss medications, and med management appointments    Valet parking services will be available as well.

## 2023-10-05 NOTE — Progress Notes (Signed)
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 Problem List:  1. Paroxysmal Atrial fib 2. Hyperlipidemia 3. Prostate surgery-status post robotic prostatectomy 4. 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  Has not fully controlled the bleeding. Best controlled by steroids.    He is a regular cyclist - rides with Shonna Chock regularly. He was cycling and went into rapid Afib with rate of 170.  He converted back to NSR in 3-4 hours. Had another episode of Afib while working in his garden.  Went to Dr.  Laverle Hobby office . Afib was documeneted.  Was started on Dilt 30 QID.  Converted to NSR the next day He stopped the Dilt a week or so later.  Has developed episodes of sinus tachycardia recently.  Also associated with some chest tightness.  Does not last long.   This am he had very slight chest tightness while he was walking.  Lasted 5  Minutes.  No diaphoresis, no radiation.  Drinks wine with dinner.  02/08/2015:  Skin is doing well. He has paroxysmal atrial fibrillation. He has not wanted to start anticoagulation yet. He's had some problems with colitis. He had a stress Myoview study as part of his workup for some vague chest tightness. Myoview revealed no evidence of ischemia. He had normal left ventricle systolic function with an ejection fraction of 75%. Has been started on Humira for Ulcerative colits . Has been cycling quite a bit.  No issues.   No recurrent episodes of atrial fib .  He thinks it may have been related to the high dose prednisone   Sept. 7, 2016:  Daniel Payne is doing well. He stopped his Eliquis   Doing well.  Cycling regularly .  November 14, 2015:   Doing well.  Still cycling .    Just got back from [facility] for snow skiing .  Daniel Payne, [place], )  Very active .  His UC is well controlled  November 26, 2016  Daniel Payne is doing well Spent the past 8 days skiing our in [place] Skis 20-25 days a year now  Does well even at high altitude. Also hiked last year in [place] without any problems   Jan 06, 2018 :  Daniel Payne is seen today for follow-up of his atrial fibrillation and hyperlipidemia.  He has maintained normal sinus rhythm. Very active.  Skis quite a bit .  Walks 2 -3 miles with the dogs 4 days . Rides 15-20 miles on his bike once a week . No palpitations  Doing well on the Dilt LA 120 mg a day   June 30, 2018:  Daniel Payne is seen as a work in visit today.  He was recently on a trans -atlantic  flight and was awake for 26 hours.  He went into atrial for ablation.  He tried an extra half a diltiazem tablet but he remains in atrial fib .   Slept well but has not converted to NSR   He is planning on going up to his house in [place], [place] for the  weekend.  Nov. 21, 2019  Daniel Payne is doing well,   He was seen for persistent AF on Oct. 23 . Started Eliquis He increased his Dilt to 120 BID, took an extra 30 mg of immediate release Dilt Converted to [employer] a cold , went back into AF. Increased his Dilt again ( 120 BID + Dilt 30 ) converted back into NSR Continued Elquis for 1 week after wards  Feels great.   Is planning on going to [place] on Dec. 7 to ski.    Jan 10, 2020: Daniel Payne is seen today for follow-up visit.  He has a history of paroxysmal atrial fibrillation.  He has as needed Eliquis that he takes for a week after he has an episode of PAF. No further episodes of PAF after increasing his Diltiazem to 120  Mg CD BID .  Favorite ski spots  [place], [place],  [place]   Nov. 18, 2021: Daniel Payne is seen today for follow up of his  PAF., hx of HLD  He has also developed DOE while cycling   For the past 6 weeks, has had increasing fatigue.  Has trouble keeping up with his cycling  friends.  He has noted that his HR does not increase with exercise  O2 sats never drop below 98%.  No CP , pressure, burning, fullness   may need myoview or coronary Ct angio  Has PAF,  Has not had an episode since last year  - not since we increased his diltiazem to 120 CD  BID .  Dec. 3, 2021  Daniel Payne is seen as a work in visit today following his stress myoview.   He had poor chronotropic response with his treadmill.  HR was 70 initially and increased to 71 with ~2 minutes of exercise. We were asked to see him because of this poor chronotropic response.  Normally, Daniel Payne is a very active man.   He cycles ~15 - 20 miles with his cycling friends weekly but has not been able to keep up for the past month.   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   He normally takes Dilt 180 CD but had held this medication the day prior to and the day of the stress test.  The myoview revealed no ischemia He is scheduled to leave for [place] tomorrow AM to ski.  I reviewed the strips with Dr. Graciela Husbands.   Our plan is to keep Daniel Payne off the Cardizem and repeat the GXT in a week to see if his chronotropic response improves.  Addendum  Dec. 5: Daniel Payne called last night from [place] and said he was flying back to [place].  Despite being off the Cardizem for 3 days , his HR remains slow and he is not able to ski at all.   He has requested that his GXT be scheduled for this upcoming week and to get an EP consult this week if possible.     Jan 18, 2021: Daniel Payne is seen . Hx of SSS. S/p pacer Because of his syncope, weakness, and pacer placement he missed most of the ski season last year. We have written a letter for a refund for his Epic pass.  Very active Rode his bike this week.  Got his HR  up to 160 Has not had atrial fib . 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 Offered to let us pick blueberries   He has been told not to use a chain saw or gas powered weed trimmer    April 01, 2022  Daniel Payne is seen for follow up of his PAF, SSS, pacer He has noticed that he has PAF when he exercises at an altitude of > 10,000 feet ( [place] )  [employer] and Eliquis if he goes into Afib  Has a pacer  Ladona Ridgel)  He has an Afib burden 0.01%  We have discussed the role of Eliquis in prevention of stroke in Afib.  He understands the risk but feels very confident in taking this PRN  Very active Cycles, skis out [place] Is a retired Best boy    November 19, 2022 Daniel Payne is seen today for follow up of his PAF, SSS, pacer ( followed by Ladona Ridgel)  Cor CTA Jan. 2024:   CAC score is 1168 ( 78th percentil for age / sex matched controls)  LAD :  mild- moderate  proximal plaque  LCx:   - minimal plaque  RCA:  mild - moderate plaque  He increased his atorva to 20  mg a day  Did not change his lipids much  Is ready to increase to Lipator 40     Had some DOE while cycling several weeks ago This seems to be improving    March 10, 2023: Daniel Payne is seen today for follow-up of his paroxysmal atrial fibrillation, sick sinus syndrome, pacemaker.  He brought an article from the [place] Journal of Medicine entitled "Apixaban for stroke prevention in subclinical atrial fibrillation" the article was a double-blind study in patients with subclinical atrial fibrillation.  They tested apixaban versus aspirin.      Apixaban    ASA Risk of stroke   0.78% per patient-year  1.24% per patient-year Major Bleeding  1.71%     0.94% Fatal bleeding  5 patients     8 patients    Is now having some muscle aches / pains while cycling   This started when he increased his atorvastatin   Anticipate referring him  to the lipid clinic for consideration of a PCSK9 inhibitor ( Repatha or Pralulent )     He is on Eliquis 5 BID  He holds his eliquis for 10 days  for each week long  [place] trip [place] . No bleeding episodes     October 05, 2023:  Daniel Payne is seen today for follow-up of his paroxysmal atrial fibrillation, hyperlipidemia, coronary calcium score of 1168  CAC score is 1168 ( 78th percentil for age / sex matched controls) He remains on Atorvastatin and Repatha  His LDL is 24   Has been out west 3 times to ski [facility]  ( has has a Sport and exercise psychologist in [place],  has offered me a week at his cost )   [facility]  Next week out to [place]   He's having no altitude issues  No CP ,  He takes Cardizem 180 QAM, 120 Q PM When he is skiing at altitude, he increases his Dilt to 180 mg BID  He holds his eliquis for 1 day prior to his sking.      Past Medical History:  Diagnosis Date   A-fib [facility])    ADHD (attention deficit hyperactivity disorder)    Cancer (HCC)  prostate dr. Ferne Reus davis   Chronic kidney disease    borderline stage III   Fissure, anal    Giardia    working in [place]   Hyperlipidemia    Infectious mononucleosis    Low back pain    Presbycusis of left ear    Spinal stenosis of lumbar region    Ulcerative colitis [facility])     Past Surgical History:  Procedure Laterality Date   ANAL FISSURE REPAIR     APPENDECTOMY     PACEMAKER IMPLANT N/A 09/10/2020   Procedure: PACEMAKER IMPLANT;  Surgeon: Marinus Maw, MD;  Location[facility] IN[facility] CV [facility];  Service: Cardiovascular;  Laterality: N/A;   PROSTATE SURGERY     ROTATOR CUFF REPAIR Left    VARICOCELE EXCISION Right      Current Outpatient Medications  Medication Sig Dispense Refill   acetaminophen (TYLENOL) 500 MG tablet Take 1,000 mg by mouth at bedtime.     apixaban (ELIQUIS) 5 MG TABS tablet Take 1 tablet (5 mg total) by mouth 2 (two) times daily. 180 tablet 3   atorvastatin (LIPITOR) 20 MG tablet Take  10 mg by mouth daily.     DILT-XR 120 MG 24 hr capsule TAKE 1 CAPSULE(120 MG) BY MOUTH EVERY EVENING 90 capsule 2   DILT-XR 180 MG 24 hr capsule TAKE 1 CAPSULE(180 MG) BY MOUTH EVERY MORNING 90 capsule 2   diltiazem (CARDIZEM) 30 MG tablet Take 1 tablet (30 mg total) by mouth 4 (four) times daily as needed (for elevated heart rates). 20 tablet 1   diphenhydrAMINE (BENADRYL) 25 MG tablet Take 25 mg by mouth at bedtime.      Evolocumab (REPATHA SURECLICK) 140 MG/ML SOAJ Inject 140 mg into the skin every 14 (fourteen) days. 2 mL 11   Melatonin 5 MG CAPS Take by mouth at bedtime.     No current facility-administered medications for this visit.    Allergies:   Concerta [methylphenidate] and Lialda [mesalamine]    Social History:  The patient  reports that he has never smoked. He has never used smokeless tobacco. He reports current alcohol use. He reports that he does not use drugs.   Family History:  The patient's family history includes Heart attack in his father; Stroke (age of onset: 51) in his mother.    ROS:  Noted in current history, otherwise review of systems is negative.   Physical Exam: Blood pressure 128/70, pulse 63, height 5\' 9"  (1.753 m), weight 155 lb 3.2 oz (70.4 kg), SpO2 99%.       GEN:  Well nourished, well developed in no acute distress HEENT: Normal NECK: No JVD; No carotid bruits LYMPHATICS: No lymphadenopathy CARDIAC: RRR , no murmurs, rubs, gallops RESPIRATORY:  Clear to auscultation without rales, wheezing or rhonchi  ABDOMEN: Soft, non-tender, non-distended MUSCULOSKELETAL:  No edema; No deformity  SKIN: Warm and dry NEUROLOGIC:  Alert and oriented x 3     EKG:      EKG Interpretation Date/Time:  Monday October 05 2023 11:27:47 EST Ventricular Rate:  60 PR Interval:  178 QRS Duration:  128 QT Interval:  450 QTC Calculation: 450 R Axis:   7  Text Interpretation: AV dual-paced rhythm When compared with ECG of 10-Sep-2020 15:48, Vent. rate has  decreased BY   7 BPM Confirmed by Kristeen Miss ([id number]) on 10/05/2023 11:32:19 AM      Recent Labs: 02/25/2023: ALT 17    Lipid Panel    Component Value Date/Time  CHOL 130 06/10/2023 0806   TRIG 78 06/10/2023 0806   HDL 81 06/10/2023 0806   CHOLHDL 1.6 06/10/2023 0806   LDLCALC 34 06/10/2023 0806      Wt Readings from Last 3 Encounters:  10/05/23 155 lb 3.2 oz (70.4 kg)  03/10/23 150 lb 3.2 oz (68.1 kg)  11/19/22 153 lb 12.8 oz (69.8 kg)      Other studies Reviewed: Additional studies/ records that were reviewed today include: . Review of the above records demonstrates:    ASSESSMENT AND PLAN:  1.  Paroxysmal atrial fibrillation / SSS  S/p pacer  Has low Afib burden (5-7% burden)  Is on eliquis     2.  Hyperlipidemia :     He has been on atorvastatin.  His last LDL is 80.  I would like to see his LDL less than 70.  He seems to be developing some muscle aches and pains on the higher dose of atorvastatin.  Will refer him to our lipid clinic for consideration of a PCSK9 inhibitor ( pralulent or Repatha,) or perhaps Inclisiran .    Current medicines are reviewed at length with the patient today.  The patient does not have concerns regarding medicines.  The following changes have been made:      Disposition:        Signed, Kristeen Miss, MD  10/05/2023 11:31 AM    [facility] [address] [place], [place], [place]  [address] Phone: [phone]; Fax: [phone]

## 2023-10-27 NOTE — Addendum Note (Signed)
Addended by: Geralyn Flash D on: 10/27/2023 12:47 PM   Modules accepted: Orders

## 2023-10-27 NOTE — Progress Notes (Signed)
 Remote pacemaker transmission.

## 2023-11-03 ENCOUNTER — Other Ambulatory Visit: Payer: Self-pay | Admitting: Student

## 2023-11-03 DIAGNOSIS — I480 Paroxysmal atrial fibrillation: Secondary | ICD-10-CM

## 2023-11-03 NOTE — Telephone Encounter (Signed)
 Prescription refill request for Eliquis received. Indication: PAF Last office visit: 10/05/23  P Nahser MD Scr: 1.32 on 09/18/22 Age: 78 Weight: 70.4kg  Based on above findings Eliquis 5mg  twice daily is the appropriate dose.  Refill approved.

## 2023-11-11 DIAGNOSIS — M7582 Other shoulder lesions, left shoulder: Secondary | ICD-10-CM | POA: Diagnosis not present

## 2023-11-11 DIAGNOSIS — M7581 Other shoulder lesions, right shoulder: Secondary | ICD-10-CM | POA: Diagnosis not present

## 2023-11-18 DIAGNOSIS — Z23 Encounter for immunization: Secondary | ICD-10-CM | POA: Diagnosis not present

## 2023-11-23 ENCOUNTER — Ambulatory Visit: Payer: Medicare Other | Admitting: Cardiovascular Disease

## 2023-12-04 DIAGNOSIS — M5136 Other intervertebral disc degeneration, lumbar region with discogenic back pain only: Secondary | ICD-10-CM | POA: Diagnosis not present

## 2023-12-04 DIAGNOSIS — M48061 Spinal stenosis, lumbar region without neurogenic claudication: Secondary | ICD-10-CM | POA: Diagnosis not present

## 2023-12-11 DIAGNOSIS — M7551 Bursitis of right shoulder: Secondary | ICD-10-CM | POA: Diagnosis not present

## 2023-12-11 DIAGNOSIS — M7582 Other shoulder lesions, left shoulder: Secondary | ICD-10-CM | POA: Diagnosis not present

## 2023-12-15 ENCOUNTER — Ambulatory Visit (INDEPENDENT_AMBULATORY_CARE_PROVIDER_SITE_OTHER): Payer: Medicare Other

## 2023-12-15 DIAGNOSIS — I441 Atrioventricular block, second degree: Secondary | ICD-10-CM | POA: Diagnosis not present

## 2023-12-16 ENCOUNTER — Encounter: Payer: Self-pay | Admitting: Internal Medicine

## 2023-12-16 LAB — CUP PACEART REMOTE DEVICE CHECK
Battery Remaining Longevity: 121 mo
Battery Voltage: 3.01 V
Brady Statistic AP VP Percent: 7.79 %
Brady Statistic AP VS Percent: 0.01 %
Brady Statistic AS VP Percent: 91.87 %
Brady Statistic AS VS Percent: 0.32 %
Brady Statistic RA Percent Paced: 7.79 %
Brady Statistic RV Percent Paced: 99.67 %
Date Time Interrogation Session: 20250408033746
Implantable Lead Connection Status: 753985
Implantable Lead Connection Status: 753985
Implantable Lead Implant Date: 20220103
Implantable Lead Implant Date: 20220103
Implantable Lead Location: 753859
Implantable Lead Location: 753860
Implantable Lead Model: 3830
Implantable Lead Model: 5076
Implantable Pulse Generator Implant Date: 20220103
Lead Channel Impedance Value: 361 Ohm
Lead Channel Impedance Value: 380 Ohm
Lead Channel Impedance Value: 399 Ohm
Lead Channel Impedance Value: 494 Ohm
Lead Channel Pacing Threshold Amplitude: 0.375 V
Lead Channel Pacing Threshold Amplitude: 0.625 V
Lead Channel Pacing Threshold Pulse Width: 0.4 ms
Lead Channel Pacing Threshold Pulse Width: 0.4 ms
Lead Channel Sensing Intrinsic Amplitude: 0.25 mV
Lead Channel Sensing Intrinsic Amplitude: 0.25 mV
Lead Channel Sensing Intrinsic Amplitude: 21.5 mV
Lead Channel Sensing Intrinsic Amplitude: 21.5 mV
Lead Channel Setting Pacing Amplitude: 1.5 V
Lead Channel Setting Pacing Amplitude: 2 V
Lead Channel Setting Pacing Pulse Width: 0.4 ms
Lead Channel Setting Sensing Sensitivity: 1.2 mV
Zone Setting Status: 755011

## 2023-12-29 DIAGNOSIS — M5116 Intervertebral disc disorders with radiculopathy, lumbar region: Secondary | ICD-10-CM | POA: Diagnosis not present

## 2023-12-29 DIAGNOSIS — M5416 Radiculopathy, lumbar region: Secondary | ICD-10-CM | POA: Diagnosis not present

## 2024-01-12 ENCOUNTER — Encounter: Payer: Self-pay | Admitting: Cardiovascular Disease

## 2024-01-12 DIAGNOSIS — M7551 Bursitis of right shoulder: Secondary | ICD-10-CM | POA: Diagnosis not present

## 2024-01-12 MED ORDER — ATORVASTATIN CALCIUM 10 MG PO TABS: 10.0000 mg | ORAL_TABLET | Freq: Every day | ORAL | 3 refills | Status: DC

## 2024-01-12 MED ORDER — ATORVASTATIN CALCIUM 20 MG PO TABS: 10.0000 mg | ORAL_TABLET | Freq: Every day | ORAL | 3 refills | Status: DC

## 2024-01-21 ENCOUNTER — Telehealth: Payer: Self-pay | Admitting: Cardiovascular Disease

## 2024-01-21 DIAGNOSIS — E785 Hyperlipidemia, unspecified: Secondary | ICD-10-CM

## 2024-01-21 DIAGNOSIS — I480 Paroxysmal atrial fibrillation: Secondary | ICD-10-CM

## 2024-01-21 NOTE — Telephone Encounter (Signed)
 Pt called in asking to speak to pharmacist Kristin. Did not wish to say what it is in regards to.

## 2024-01-22 ENCOUNTER — Other Ambulatory Visit: Payer: Self-pay

## 2024-01-22 DIAGNOSIS — E782 Mixed hyperlipidemia: Secondary | ICD-10-CM

## 2024-01-22 DIAGNOSIS — E785 Hyperlipidemia, unspecified: Secondary | ICD-10-CM

## 2024-01-22 DIAGNOSIS — Z789 Other specified health status: Secondary | ICD-10-CM

## 2024-01-22 NOTE — Telephone Encounter (Signed)
 Spoke with pt and explained Dr. Letta Raw recommendations for repeat labs of a BMP and Mag. Explained to pt these have already been ordered and released into the [employer] system. Pt asked to have a CMP instead and also add on a CBC with differential. Labs have been updated. Pt states he will go get these labs drawn Monday 5/19 when back in town.

## 2024-01-22 NOTE — Progress Notes (Signed)
 BMP and Magnesium ordered per Dr. Alroy Aspen.

## 2024-01-25 ENCOUNTER — Telehealth: Payer: Self-pay

## 2024-01-25 ENCOUNTER — Other Ambulatory Visit: Payer: Self-pay

## 2024-01-25 DIAGNOSIS — Z789 Other specified health status: Secondary | ICD-10-CM

## 2024-01-25 DIAGNOSIS — Z79899 Other long term (current) drug therapy: Secondary | ICD-10-CM

## 2024-01-25 DIAGNOSIS — E782 Mixed hyperlipidemia: Secondary | ICD-10-CM

## 2024-01-25 DIAGNOSIS — I480 Paroxysmal atrial fibrillation: Secondary | ICD-10-CM | POA: Diagnosis not present

## 2024-01-25 DIAGNOSIS — E785 Hyperlipidemia, unspecified: Secondary | ICD-10-CM | POA: Diagnosis not present

## 2024-01-25 NOTE — Telephone Encounter (Signed)
 Called and spoke with patient who said "stop, stop.. blood work has already been drawn." He states it was a CMET, lipids, magnesium level, and sedimentation rate. He states he doesn't need repeat orders placed and would like this to be put into the record so we have the up-to-date information. Advised I would do so.  Original message was forwarded to PharmD team by MD, so will await their response regarding additional recommendations.

## 2024-01-25 NOTE — Telephone Encounter (Signed)
-----   Message from Ahmad Alert sent at 01/21/2024  5:38 PM EDT ----- Regarding: Intense muscle cramps Hi Daniel Payne called me on my cell today complaining of cramping in his hands.  He is on repeat ha and atorvastatin  10 mg.   I've asked him to stop both the atorva and repeatha for now. Please order a bmp and mag level.  Please have our PharmD call and help with recommendations .  I will not be back in the office until June 2 Thanks  PN

## 2024-01-26 ENCOUNTER — Ambulatory Visit: Payer: Self-pay | Admitting: Internal Medicine

## 2024-01-26 LAB — COMPREHENSIVE METABOLIC PANEL WITH GFR
ALT: 35 IU/L (ref 0–44)
AST: 23 IU/L (ref 0–40)
Albumin: 4.1 g/dL (ref 3.8–4.8)
Alkaline Phosphatase: 69 IU/L (ref 44–121)
BUN/Creatinine Ratio: 24 (ref 10–24)
BUN: 28 mg/dL — ABNORMAL HIGH (ref 8–27)
Bilirubin Total: 0.4 mg/dL (ref 0.0–1.2)
CO2: 21 mmol/L (ref 20–29)
Calcium: 9.7 mg/dL (ref 8.6–10.2)
Chloride: 100 mmol/L (ref 96–106)
Creatinine, Ser: 1.17 mg/dL (ref 0.76–1.27)
Globulin, Total: 2.4 g/dL (ref 1.5–4.5)
Glucose: 99 mg/dL (ref 70–99)
Potassium: 4.7 mmol/L (ref 3.5–5.2)
Sodium: 139 mmol/L (ref 134–144)
Total Protein: 6.5 g/dL (ref 6.0–8.5)
eGFR: 63 mL/min/{1.73_m2} (ref >59)

## 2024-01-26 LAB — CBC WITH DIFFERENTIAL/PLATELET
Basophils Absolute: 0 10*3/uL (ref 0.0–0.2)
Basos: 0 %
EOS (ABSOLUTE): 0 10*3/uL (ref 0.0–0.4)
Eos: 0 %
Hematocrit: 43.3 % (ref 37.5–51.0)
Hemoglobin: 14.7 g/dL (ref 13.0–17.7)
Immature Grans (Abs): 0 10*3/uL (ref 0.0–0.1)
Immature Granulocytes: 0 %
Lymphocytes Absolute: 2.4 10*3/uL (ref 0.7–3.1)
Lymphs: 19 %
MCH: 32 pg (ref 26.6–33.0)
MCHC: 33.9 g/dL (ref 31.5–35.7)
MCV: 94 fL (ref 79–97)
Monocytes Absolute: 1 10*3/uL — ABNORMAL HIGH (ref 0.1–0.9)
Monocytes: 8 %
Neutrophils Absolute: 9 10*3/uL — ABNORMAL HIGH (ref 1.4–7.0)
Neutrophils: 73 %
Platelets: 274 10*3/uL (ref 150–450)
RBC: 4.59 x10E6/uL (ref 4.14–5.80)
RDW: 12.7 % (ref 11.6–15.4)
WBC: 12.5 10*3/uL — ABNORMAL HIGH (ref 3.4–10.8)

## 2024-01-26 LAB — LIPID PANEL
Chol/HDL Ratio: 1.8 ratio (ref 0.0–5.0)
Cholesterol, Total: 152 mg/dL (ref 100–199)
HDL: 86 mg/dL (ref >39)
LDL Chol Calc (NIH): 49 mg/dL (ref 0–99)
Triglycerides: 92 mg/dL (ref 0–149)
VLDL Cholesterol Cal: 17 mg/dL (ref 5–40)

## 2024-01-26 LAB — MAGNESIUM: Magnesium: 2.2 mg/dL (ref 1.6–2.3)

## 2024-01-28 NOTE — Telephone Encounter (Signed)
 Returned a call back to the pt and endorsed recommendations per Modesta Andrea RPH in lipid clinic.   Pt verbalized understanding and agrees with this plan.

## 2024-01-28 NOTE — Telephone Encounter (Signed)
 I would have him hold Repatha  and atorvastatin , like he is doing, until the cramping resolves. Then I would have him resume just the Repatha  and let us  know if the cramping comes back.

## 2024-02-02 NOTE — Addendum Note (Signed)
 Addended by: Lott Rouleau A on: 02/02/2024 10:39 AM   Modules accepted: Orders

## 2024-02-02 NOTE — Progress Notes (Signed)
 Remote pacemaker transmission.

## 2024-02-03 DIAGNOSIS — N1831 Chronic kidney disease, stage 3a: Secondary | ICD-10-CM | POA: Diagnosis not present

## 2024-02-03 DIAGNOSIS — I495 Sick sinus syndrome: Secondary | ICD-10-CM | POA: Diagnosis not present

## 2024-02-03 DIAGNOSIS — K5190 Ulcerative colitis, unspecified, without complications: Secondary | ICD-10-CM | POA: Diagnosis not present

## 2024-02-03 DIAGNOSIS — R053 Chronic cough: Secondary | ICD-10-CM | POA: Diagnosis not present

## 2024-02-03 DIAGNOSIS — M4807 Spinal stenosis, lumbosacral region: Secondary | ICD-10-CM | POA: Diagnosis not present

## 2024-02-03 DIAGNOSIS — Z79899 Other long term (current) drug therapy: Secondary | ICD-10-CM | POA: Diagnosis not present

## 2024-02-03 DIAGNOSIS — E7800 Pure hypercholesterolemia, unspecified: Secondary | ICD-10-CM | POA: Diagnosis not present

## 2024-02-03 DIAGNOSIS — Z950 Presence of cardiac pacemaker: Secondary | ICD-10-CM | POA: Diagnosis not present

## 2024-02-03 DIAGNOSIS — I700 Atherosclerosis of aorta: Secondary | ICD-10-CM | POA: Diagnosis not present

## 2024-02-03 DIAGNOSIS — I480 Paroxysmal atrial fibrillation: Secondary | ICD-10-CM | POA: Diagnosis not present

## 2024-02-03 DIAGNOSIS — J851 Abscess of lung with pneumonia: Secondary | ICD-10-CM | POA: Diagnosis not present

## 2024-02-03 DIAGNOSIS — Z8546 Personal history of malignant neoplasm of prostate: Secondary | ICD-10-CM | POA: Diagnosis not present

## 2024-02-09 DIAGNOSIS — H33322 Round hole, left eye: Secondary | ICD-10-CM | POA: Diagnosis not present

## 2024-02-09 DIAGNOSIS — H4423 Degenerative myopia, bilateral: Secondary | ICD-10-CM | POA: Diagnosis not present

## 2024-02-09 DIAGNOSIS — H1045 Other chronic allergic conjunctivitis: Secondary | ICD-10-CM | POA: Diagnosis not present

## 2024-02-09 DIAGNOSIS — H57813 Brow ptosis, bilateral: Secondary | ICD-10-CM | POA: Diagnosis not present

## 2024-02-09 DIAGNOSIS — H43813 Vitreous degeneration, bilateral: Secondary | ICD-10-CM | POA: Diagnosis not present

## 2024-02-09 DIAGNOSIS — H11823 Conjunctivochalasis, bilateral: Secondary | ICD-10-CM | POA: Diagnosis not present

## 2024-02-09 DIAGNOSIS — H02423 Myogenic ptosis of bilateral eyelids: Secondary | ICD-10-CM | POA: Diagnosis not present

## 2024-02-09 DIAGNOSIS — H2513 Age-related nuclear cataract, bilateral: Secondary | ICD-10-CM | POA: Diagnosis not present

## 2024-02-17 ENCOUNTER — Other Ambulatory Visit: Payer: Self-pay | Admitting: Cardiovascular Disease

## 2024-03-09 DIAGNOSIS — M48061 Spinal stenosis, lumbar region without neurogenic claudication: Secondary | ICD-10-CM | POA: Diagnosis not present

## 2024-03-10 ENCOUNTER — Ambulatory Visit: Attending: Internal Medicine | Admitting: Internal Medicine

## 2024-03-10 ENCOUNTER — Encounter: Payer: Self-pay | Admitting: Internal Medicine

## 2024-03-10 VITALS — BP 144/78 | HR 70 | Ht 69.0 in | Wt 143.0 lb

## 2024-03-10 DIAGNOSIS — I442 Atrioventricular block, complete: Secondary | ICD-10-CM | POA: Diagnosis not present

## 2024-03-10 NOTE — Patient Instructions (Signed)

## 2024-03-10 NOTE — Progress Notes (Signed)
 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 He is a very active 78 yo man with a h/o symptomatic tach-brady syndrome. 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 He has been fairly aware of his episodes of atrial fib in the past. He has mostly had problems with atrial fib when he is skiing at elevation. Since I saw him last over 2 years ago, he has not had much in the way of afib symptoms and he has developed progressive conduction system disease. He holds his systemic anti-coagulation when he goes snow skiing.  Allergies  Allergen Reactions   Concerta [Methylphenidate] Other (See Comments)    Burning mouth   Lialda [Mesalamine] Diarrhea     Current Outpatient Medications  Medication Sig Dispense Refill   acetaminophen  (TYLENOL ) 500 MG tablet Take 1,000 mg by mouth at bedtime. (Patient taking differently: Take 1,000 mg by mouth in the morning.)     apixaban  (ELIQUIS ) 5 MG TABS tablet TAKE 1 TABLET(5 MG) BY MOUTH TWICE DAILY 180 tablet 1   celecoxib (CELEBREX) 100 MG capsule Take 100 mg by mouth daily.     DILT-XR 120 MG 24 hr capsule TAKE 1 CAPSULE(120 MG) BY MOUTH EVERY EVENING 90 capsule 2   DILT-XR 180 MG 24 hr capsule TAKE 1 CAPSULE(180 MG) BY MOUTH EVERY MORNING 90 capsule 2   diltiazem  (CARDIZEM ) 30 MG tablet Take 1 tablet (30 mg total) by mouth 4 (four) times daily as needed (for elevated heart rates). 20 tablet 1   diphenhydrAMINE (BENADRYL) 25 MG tablet Take 25 mg by mouth at bedtime.      Evolocumab  (REPATHA  SURECLICK) 140 MG/ML SOAJ Inject 140 mg into the skin every 14 (fourteen) days. 2 mL 11   Melatonin 5 MG CAPS Take by mouth at bedtime.     traMADol (ULTRAM) 50 MG tablet Take 50 mg by mouth daily as needed. (Patient taking differently: Take 50 mg by mouth daily.)     No current facility-administered medications for this visit.     Past Medical History:  Diagnosis Date   A-fib [facility])    ADHD (attention deficit hyperactivity  disorder)    Cancer [facility])    prostate dr. renay davis   Chronic kidney disease    borderline stage III   Fissure, anal    Giardia    [place]   Hyperlipidemia    Infectious mononucleosis    Low back pain    Presbycusis of left ear    Spinal stenosis of lumbar region    Ulcerative colitis (HCC)     ROS:   All systems reviewed and negative except as noted in the HPI.   Past Surgical History:  Procedure Laterality Date   ANAL FISSURE REPAIR     APPENDECTOMY     PACEMAKER IMPLANT N/A 09/10/2020   Procedure: PACEMAKER IMPLANT;  Surgeon: Waddell Danelle ORN, MD;  Location: [facility] INVAS[facility] CV LAB;  Service: Cardiovascular;  Laterality: N/A;   PROSTATE SURGERY     ROTATOR CUFF REPAIR Left    VARICOCELE EXCISION Right      Family History  Problem Relation Age of Onset   Stroke Mother 43   Heart attack Father      Social History   Socioeconomic History   Marital status: Married    Spouse name: Not on file   Number of children: Not on file   Years of education: Not on file  Highest education level: Not on file  Occupational History   Not on file  Tobacco Use   Smoking status: Never   Smokeless tobacco: Never  Vaping Use   Vaping status: Never Used  Substance and Sexual Activity   Alcohol use: Yes    Alcohol/week: 0.0 standard drinks of alcohol    Comment: 1-2 glasses of red wine   Drug use: No   Sexual activity: Not on file  Other Topics Concern   Not on file  Social History Narrative   Not on file   Social Drivers of Health   Financial Resource Strain: Not on file  Food Insecurity: Not on file  Transportation Needs: Not on file  Physical Activity: Not on file  Stress: Not on file  Social Connections: Not on file  Intimate Partner Violence: Not on file     BP (!) 144/78 (BP Location: Left Arm, Patient Position: Sitting, Cuff Size: Normal)   Pulse 70   Ht 5' 9 (1.753 m)   Wt 143 lb (64.9 kg)   SpO2 96%   BMI 21.12 kg/m   Physical Exam:  Well  appearing 78 yo man, NAD HEENT: Unremarkable Neck:  No JVD, no thyromegally Lymphatics:  No adenopathy Back:  No CVA tenderness Lungs:  Clear with no wheezes HEART:  Regular rate rhythm, no murmurs, no rubs, no clicks Abd:  soft, positive bowel sounds, no organomegally, no rebound, no guarding Ext:  2 plus pulses, no edema, no cyanosis, no clubbing Skin:  No rashes no nodules Neuro:  CN II through XII intact, motor grossly intact  DEVICE  Normal device function.  See PaceArt for details.   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 He is asymptomatic s/p PPM insertion. He no longer feels his atrial fib as his VR is now well controlled due to the heart block. PPM -his medtronic DDD PM is working normally. We will recheck in several months. Coags - we discussed the rationale for systemic anti-coagulation as well as possible options for not using. I have recommended he continue for now. His PM interrogation shows 3 episodes of atrial fib over the last 3 months and he is now out of rhythm about 3% of the time.   For this reason I have recommended he stay on Eliquis  unless he is going skiing and this should probably the last year he ski's. I do not think that wading in [place] streams is of sufficient risk not to take anti-coagulation.  PAF - he will likely have more. We discussed the indications for catheter ablation and he currently does not have an indication for ablation or AA drug therapy. If his afib worsens and he develops symptoms then he will consider catheter ablation. He would need a flutter ablation as well.   Danelle Amron Guerrette,MD

## 2024-03-24 ENCOUNTER — Ambulatory Visit

## 2024-03-24 DIAGNOSIS — I442 Atrioventricular block, complete: Secondary | ICD-10-CM | POA: Diagnosis not present

## 2024-03-25 LAB — CUP PACEART REMOTE DEVICE CHECK
Battery Remaining Longevity: 117 mo
Battery Voltage: 3.01 V
Brady Statistic AP VP Percent: 3.47 %
Brady Statistic AP VS Percent: 0 %
Brady Statistic AS VP Percent: 96.45 %
Brady Statistic AS VS Percent: 0.08 %
Brady Statistic RA Percent Paced: 3.48 %
Brady Statistic RV Percent Paced: 99.92 %
Date Time Interrogation Session: 20250716214211
Implantable Lead Connection Status: 753985
Implantable Lead Connection Status: 753985
Implantable Lead Implant Date: 20220103
Implantable Lead Implant Date: 20220103
Implantable Lead Location: 753859
Implantable Lead Location: 753860
Implantable Lead Model: 3830
Implantable Lead Model: 5076
Implantable Pulse Generator Implant Date: 20220103
Lead Channel Impedance Value: 342 Ohm
Lead Channel Impedance Value: 361 Ohm
Lead Channel Impedance Value: 399 Ohm
Lead Channel Impedance Value: 494 Ohm
Lead Channel Pacing Threshold Amplitude: 0.375 V
Lead Channel Pacing Threshold Amplitude: 0.75 V
Lead Channel Pacing Threshold Pulse Width: 0.4 ms
Lead Channel Pacing Threshold Pulse Width: 0.4 ms
Lead Channel Sensing Intrinsic Amplitude: 1.25 mV
Lead Channel Sensing Intrinsic Amplitude: 1.25 mV
Lead Channel Sensing Intrinsic Amplitude: 19.125 mV
Lead Channel Sensing Intrinsic Amplitude: 19.125 mV
Lead Channel Setting Pacing Amplitude: 1.5 V
Lead Channel Setting Pacing Amplitude: 2 V
Lead Channel Setting Pacing Pulse Width: 0.4 ms
Lead Channel Setting Sensing Sensitivity: 1.2 mV
Zone Setting Status: 755011

## 2024-03-28 ENCOUNTER — Ambulatory Visit: Payer: Self-pay | Admitting: Internal Medicine

## 2024-04-04 ENCOUNTER — Other Ambulatory Visit (INDEPENDENT_AMBULATORY_CARE_PROVIDER_SITE_OTHER): Payer: Self-pay

## 2024-04-04 ENCOUNTER — Ambulatory Visit (INDEPENDENT_AMBULATORY_CARE_PROVIDER_SITE_OTHER): Admitting: Orthopedic Surgery

## 2024-04-04 ENCOUNTER — Other Ambulatory Visit: Payer: Self-pay

## 2024-04-04 ENCOUNTER — Encounter: Payer: Self-pay | Admitting: Orthopedic Surgery

## 2024-04-04 DIAGNOSIS — M19012 Primary osteoarthritis, left shoulder: Secondary | ICD-10-CM | POA: Diagnosis not present

## 2024-04-04 DIAGNOSIS — M25512 Pain in left shoulder: Secondary | ICD-10-CM

## 2024-04-04 MED ORDER — TRIAMCINOLONE ACETONIDE 40 MG/ML IJ SUSP
40.0000 mg | INTRAMUSCULAR | Status: AC | PRN
  Administered 2024-04-04: 40 mg via INTRA_ARTICULAR

## 2024-04-04 MED ORDER — LIDOCAINE HCL 1 % IJ SOLN
5.0000 mL | INTRAMUSCULAR | Status: AC | PRN
  Administered 2024-04-04: 5 mL

## 2024-04-04 MED ORDER — BUPIVACAINE HCL 0.5 % IJ SOLN
9.0000 mL | INTRAMUSCULAR | Status: AC | PRN
  Administered 2024-04-04: 9 mL via INTRA_ARTICULAR

## 2024-04-04 NOTE — Progress Notes (Unsigned)
 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 Coronary calcium  score 1168 placing him at the 78th percentile. Had muscle pain with higher doses of lipitor, now on repatha  with LDL 24.   He is an avid skiier and now increases cardizem  to 180 qAM and 120 qPM when at altitude.   He is retired Education officer, environmental.   He presents today for routine follow up. He was recently seen by Dr. Birmingham 03/2024.   He is doing well from a cardiovascular standpoint, less than 0.1% Afib burden on recent interrogation. No bleeding on eliquis . Rode 15 miles this morning on his bike, no chest pain.  No longer taking lipitor, this has resolved muscle pain. Doing well on repatha .   Past Medical History:  Diagnosis Date   A-fib [facility])    ADHD (attention deficit hyperactivity disorder)    Cancer (HCC)    prostate dr. renay Payne   Chronic kidney disease    borderline stage III   Fissure, anal    Giardia    working in [employer]   Hyperlipidemia    Infectious mononucleosis    Low back pain    Presbycusis of left ear    Spinal stenosis of lumbar region    Ulcerative colitis [facility])     Past Surgical History:  Procedure Laterality Date   ANAL FISSURE REPAIR     APPENDECTOMY     PACEMAKER IMPLANT N/A 09/10/2020   Procedure: PACEMAKER IMPLANT;  Surgeon: Birmingham Danelle ORN, MD;  Location: [facility] INVASIVE CV LAB;  Service: Cardiovascular;  Laterality: N/A;    PROSTATE SURGERY     ROTATOR CUFF REPAIR Left    VARICOCELE EXCISION Right     Current Medications: Current Meds  Medication Sig   acetaminophen  (TYLENOL ) 500 MG tablet Take 1,000 mg by mouth at bedtime. (Patient taking differently: Take 1,000 mg by mouth in the morning.)   apixaban  (ELIQUIS ) 5 MG TABS tablet TAKE 1 TABLET(5 MG) BY MOUTH TWICE DAILY   celecoxib (CELEBREX) 100 MG capsule Take 100 mg by mouth daily.   DILT-XR 120 MG 24 hr capsule TAKE 1 CAPSULE(120 MG) BY MOUTH EVERY EVENING   DILT-XR 180 MG 24 hr capsule TAKE 1 CAPSULE(180 MG) BY MOUTH EVERY MORNING   diphenhydrAMINE (BENADRYL) 25 MG tablet Take 25 mg by mouth at bedtime.    Evolocumab  (REPATHA  SURECLICK) 140 MG/ML SOAJ Inject 140 mg into the skin every 14 (fourteen) days.   Melatonin 5 MG CAPS Take by mouth at bedtime.   traMADol (ULTRAM) 50 MG tablet Take 50 mg by mouth daily as needed. (Patient taking differently: Take 50 mg by mouth daily.)     Allergies:   Atorvastatin , Concerta [methylphenidate], and Lialda [mesalamine]   Social History   Socioeconomic History   Marital status: Married    Spouse name: Not on file  Number of children: Not on file   Years of education: Not on file   Highest education level: Not on file  Occupational History   Not on file  Tobacco Use   Smoking status: Never   Smokeless tobacco: Never  Vaping Use   Vaping status: Never Used  Substance and Sexual Activity   Alcohol use: Yes    Alcohol/week: 0.0 standard drinks of alcohol    Comment: 1-2 glasses of red wine   Drug use: No   Sexual activity: Not on file  Other Topics Concern   Not on file  Social History Narrative   Not on file   Social Drivers of Health   Financial Resource Strain: Not on file  Food Insecurity: Not on file  Transportation Needs: Not on file  Physical Activity: Not on file  Stress: Not on file  Social Connections: Not on file     Family History: The patient's family history includes Heart  attack in his father; Stroke (age of onset: 71) in his mother.  ROS:   Please see the history of present illness.     All other systems reviewed and are negative.  EKGs/Labs/Other Studies Reviewed:    The following studies were reviewed today:       Recent Labs: 01/25/2024: ALT 35; BUN 28; Creatinine, Ser 1.17; Hemoglobin 14.7; Magnesium 2.2; Platelets 274; Potassium 4.7; Sodium 139  Recent Lipid Panel    Component Value Date/Time   CHOL 152 01/25/2024 0820   TRIG 92 01/25/2024 0820   HDL 86 01/25/2024 0820   CHOLHDL 1.8 01/25/2024 0820   LDLCALC 49 01/25/2024 0820     Risk Assessment/Calculations:    CHA2DS2-VASc Score = 3   This indicates a 3.2% annual risk of stroke. The patient's score is based upon: CHF History: 0 HTN History: 0 Diabetes History: 0 Stroke History: 0 Vascular Disease History: 1 Age Score: 2 Gender Score: 0            Physical Exam:    VS:  BP 135/73 (BP Location: Left Arm, Patient Position: Sitting, Cuff Size: Normal)   Pulse 73   Resp 16   Ht 5' 9 (1.753 m)   Wt 147 lb 3.2 oz (66.8 kg)   SpO2 94%   BMI 21.74 kg/m     Wt Readings from Last 3 Encounters:  04/06/24 147 lb 3.2 oz (66.8 kg)  03/10/24 143 lb (64.9 kg)  10/05/23 155 lb 3.2 oz (70.4 kg)     GEN:  Well nourished, well developed in no acute distress HEENT: Normal NECK: No JVD; No carotid bruits LYMPHATICS: No lymphadenopathy CARDIAC: RRR, no murmurs, rubs, gallops RESPIRATORY:  Clear to auscultation without rales, wheezing or rhonchi  ABDOMEN: Soft, non-tender, non-distended MUSCULOSKELETAL:  No edema; No deformity  SKIN: Warm and dry NEUROLOGIC:  Alert and oriented x 3 PSYCHIATRIC:  Normal affect   ASSESSMENT:    1. PAF (paroxysmal atrial fibrillation) (HCC)   2. Chronic anticoagulation   3. Hyperlipidemia, unspecified hyperlipidemia type   4. Statin intolerance   5. AV block, Mobitz 2   6. Pacemaker   7. 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 Waddell - recently seen, normal pacer function   Coronary  calcification - risk factor modification - no chest pain, remains very active   Hyperlipidemia with LDL goal < 70 - now on repatha  alone - did not tolerate lipitor, does not want to try crestor - LPA low  Checking labs at his request.   Follow up with Dr. Francyne in 6 months.          Medication Adjustments/Labs and Tests Ordered: Current medicines are reviewed at length with the patient today.  Concerns regarding medicines are outlined above.  Orders Placed This Encounter  Procedures   CBC w/Diff   Comp Met (CMET)   Lipid panel   No orders of the defined types were placed in this encounter.   Patient Instructions  Medication Instructions:  Your physician recommends that you continue on your current medications as directed. Please refer to the Current Medication list given to you today.  *If you need a refill on your cardiac medications before your next appointment, please call your pharmacy*  Lab Work: TODAY: CBC W/DIFF, CMET, LIPIDS If you have labs (blood work) drawn today and your tests are completely normal, you will receive your results only by: MyChart Message (if you have MyChart) OR A paper copy in the mail If you have any lab test that is abnormal or we need to change your treatment, we will call you to review the results.  Testing/Procedures: NONE  Follow-Up: At [facility], you and your health needs are our priority.  As part of our continuing mission to provide you with exceptional heart care, our providers are all part of one team.  This team includes your primary Cardiologist (physician) and Advanced Practice Providers or APPs  (Physician Assistants and Nurse Practitioners) who all work together to provide you with the care you need, when you need it.  Your next appointment:   6 month(s)  Provider:   Jerel Francyne, MD   We recommend signing up for the patient portal called MyChart.  Sign up information is provided on this After Visit Summary.  MyChart is used to connect with patients for Virtual Visits (Telemedicine).  Patients are able to view lab/test results, encounter notes, upcoming appointments, etc.  Non-urgent messages can be sent to your provider as well.   To learn more about what you can do with MyChart, go to [email or web address].      Signed, Jon Garre Marche Hottenstein, PA  04/06/2024 2:03 PM    [facility]
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
## 2024-04-05 ENCOUNTER — Telehealth: Payer: Self-pay | Admitting: Pharmacy Technician

## 2024-04-05 ENCOUNTER — Other Ambulatory Visit: Payer: Self-pay | Admitting: Pharmacist

## 2024-04-05 MED ORDER — REPATHA SURECLICK 140 MG/ML ~~LOC~~ SOAJ: 1.0000 mL | SUBCUTANEOUS | 11 refills | Status: AC

## 2024-04-05 NOTE — Telephone Encounter (Signed)
 Pharmacy Patient Advocate Encounter  Received notification from [facility] that Prior Authorization for REPATHA  has been APPROVED from 04/05/24 to 04/05/25

## 2024-04-05 NOTE — Telephone Encounter (Signed)
   Pharmacy Patient Advocate Encounter   Received notification from CoverMyMeds that prior authorization for REPATHA  is required/requested.   Insurance verification completed.   The patient is insured through [facility] .   Per test claim: PA required; PA submitted to above mentioned insurance via LATENT Key/confirmation #/EOC [id number] Status is pending

## 2024-04-06 ENCOUNTER — Encounter: Payer: Self-pay | Admitting: Physician Assistant

## 2024-04-06 ENCOUNTER — Ambulatory Visit: Attending: Cardiology | Admitting: Physician Assistant

## 2024-04-06 VITALS — BP 135/73 | HR 73 | Resp 16 | Ht 69.0 in | Wt 147.2 lb

## 2024-04-06 DIAGNOSIS — Z789 Other specified health status: Secondary | ICD-10-CM | POA: Insufficient documentation

## 2024-04-06 DIAGNOSIS — I2510 Atherosclerotic heart disease of native coronary artery without angina pectoris: Secondary | ICD-10-CM | POA: Diagnosis not present

## 2024-04-06 DIAGNOSIS — Z950 Presence of cardiac pacemaker: Secondary | ICD-10-CM | POA: Diagnosis not present

## 2024-04-06 DIAGNOSIS — E785 Hyperlipidemia, unspecified: Secondary | ICD-10-CM | POA: Insufficient documentation

## 2024-04-06 DIAGNOSIS — Z7901 Long term (current) use of anticoagulants: Secondary | ICD-10-CM | POA: Diagnosis not present

## 2024-04-06 DIAGNOSIS — I480 Paroxysmal atrial fibrillation: Secondary | ICD-10-CM | POA: Diagnosis not present

## 2024-04-06 DIAGNOSIS — I441 Atrioventricular block, second degree: Secondary | ICD-10-CM | POA: Diagnosis not present

## 2024-04-06 NOTE — Patient Instructions (Signed)
 Medication Instructions:  Your physician recommends that you continue on your current medications as directed. Please refer to the Current Medication list given to you today.  *If you need a refill on your cardiac medications before your next appointment, please call your pharmacy*  Lab Work: TODAY: CBC W/DIFF, CMET, LIPIDS If you have labs (blood work) drawn today and your tests are completely normal, you will receive your results only by: MyChart Message (if you have MyChart) OR A paper copy in the mail If you have any lab test that is abnormal or we need to change your treatment, we will call you to review the results.  Testing/Procedures: NONE  Follow-Up: At [facility], you and your health needs are our priority.  As part of our continuing mission to provide you with exceptional heart care, our providers are all part of one team.  This team includes your primary Cardiologist (physician) and Advanced Practice Providers or APPs (Physician Assistants and Nurse Practitioners) who all work together to provide you with the care you need, when you need it.  Your next appointment:   6 month(s)  Provider:   Jerel Balding, MD   We recommend signing up for the patient portal called MyChart.  Sign up information is provided on this After Visit Summary.  MyChart is used to connect with patients for Virtual Visits (Telemedicine).  Patients are able to view lab/test results, encounter notes, upcoming appointments, etc.  Non-urgent messages can be sent to your provider as well.   To learn more about what you can do with MyChart, go to [email or web address].

## 2024-04-07 ENCOUNTER — Ambulatory Visit: Payer: Self-pay | Admitting: Physician Assistant

## 2024-04-07 LAB — CBC WITH DIFFERENTIAL/PLATELET
Basophils Absolute: 0 x10E3/uL (ref 0.0–0.2)
Basos: 0 %
EOS (ABSOLUTE): 0 x10E3/uL (ref 0.0–0.4)
Eos: 0 %
Hematocrit: 44 % (ref 37.5–51.0)
Hemoglobin: 14.9 g/dL (ref 13.0–17.7)
Immature Grans (Abs): 0 x10E3/uL (ref 0.0–0.1)
Immature Granulocytes: 0 %
Lymphocytes Absolute: 1.2 x10E3/uL (ref 0.7–3.1)
Lymphs: 10 %
MCH: 32.4 pg (ref 26.6–33.0)
MCHC: 33.9 g/dL (ref 31.5–35.7)
MCV: 96 fL (ref 79–97)
Monocytes Absolute: 0.6 x10E3/uL (ref 0.1–0.9)
Monocytes: 5 %
Neutrophils Absolute: 10.7 x10E3/uL — ABNORMAL HIGH (ref 1.4–7.0)
Neutrophils: 85 %
Platelets: 254 x10E3/uL (ref 150–450)
RBC: 4.6 x10E6/uL (ref 4.14–5.80)
RDW: 12.5 % (ref 11.6–15.4)
WBC: 12.5 x10E3/uL — ABNORMAL HIGH (ref 3.4–10.8)

## 2024-04-07 LAB — COMPREHENSIVE METABOLIC PANEL WITH GFR
ALT: 19 IU/L (ref 0–44)
AST: 19 IU/L (ref 0–40)
Albumin: 4.4 g/dL (ref 3.8–4.8)
Alkaline Phosphatase: 62 IU/L (ref 44–121)
BUN/Creatinine Ratio: 32 — ABNORMAL HIGH (ref 10–24)
BUN: 42 mg/dL — ABNORMAL HIGH (ref 8–27)
Bilirubin Total: 0.3 mg/dL (ref 0.0–1.2)
CO2: 19 mmol/L — ABNORMAL LOW (ref 20–29)
Calcium: 9.8 mg/dL (ref 8.6–10.2)
Chloride: 100 mmol/L (ref 96–106)
Creatinine, Ser: 1.31 mg/dL — ABNORMAL HIGH (ref 0.76–1.27)
Globulin, Total: 2.9 g/dL (ref 1.5–4.5)
Glucose: 116 mg/dL — ABNORMAL HIGH (ref 70–99)
Potassium: 4.5 mmol/L (ref 3.5–5.2)
Sodium: 138 mmol/L (ref 134–144)
Total Protein: 7.3 g/dL (ref 6.0–8.5)
eGFR: 55 mL/min/1.73 — ABNORMAL LOW (ref >59)

## 2024-04-07 LAB — LIPID PANEL
Chol/HDL Ratio: 1.8 ratio (ref 0.0–5.0)
Cholesterol, Total: 181 mg/dL (ref 100–199)
HDL: 103 mg/dL (ref >39)
LDL Chol Calc (NIH): 58 mg/dL (ref 0–99)
Triglycerides: 121 mg/dL (ref 0–149)
VLDL Cholesterol Cal: 20 mg/dL (ref 5–40)

## 2024-04-25 ENCOUNTER — Encounter: Payer: Self-pay | Admitting: Cardiovascular Disease

## 2024-04-25 ENCOUNTER — Telehealth: Payer: Self-pay | Admitting: Cardiovascular Disease

## 2024-04-25 NOTE — Telephone Encounter (Signed)
 Pt called and stated that he has a form that needs that he need filled out for a ski trip. He stated he was going to bring it by the office.  He is also requesting a call back from [facility] number [phone]

## 2024-04-25 NOTE — Telephone Encounter (Signed)
 I will be happy to sign the form for him. I will be in clinic Tuesday morning and Thursday morning.

## 2024-04-26 ENCOUNTER — Telehealth: Payer: Self-pay | Admitting: Cardiovascular Disease

## 2024-04-26 NOTE — Telephone Encounter (Signed)
 I never did get the signed this form.  Could not find it.  I will be back in the office on Thursday.

## 2024-04-26 NOTE — Telephone Encounter (Signed)
 Paper Work Dropped Off: [place] paperwork (pt states that he was told by Dr. JAYSON to bring it directly to him in office. Pt was made aware that Dr. JAYSON has left for today & will not return into the office until Thurs & Fri of this week).  Date: 04-26-24  Location of paper:  Dr. Fanny raenette MALTESE, 04-26-24

## 2024-04-28 NOTE — Telephone Encounter (Signed)
 Informed the patient that the paperwork is ready to pick up. Pick up at Check in desk to the right after you get off of elevator. ([facility]- check in) He verbalized understanding and his wife will come pick it up.

## 2024-05-05 NOTE — Telephone Encounter (Signed)
 Already taken care of see 8/21 telephone note.

## 2024-05-22 DIAGNOSIS — Z23 Encounter for immunization: Secondary | ICD-10-CM | POA: Diagnosis not present

## 2024-05-26 ENCOUNTER — Ambulatory Visit (INDEPENDENT_AMBULATORY_CARE_PROVIDER_SITE_OTHER): Admitting: Orthopedic Surgery

## 2024-05-26 ENCOUNTER — Encounter: Payer: Self-pay | Admitting: Orthopedic Surgery

## 2024-05-26 ENCOUNTER — Other Ambulatory Visit: Payer: Self-pay

## 2024-05-26 DIAGNOSIS — M25512 Pain in left shoulder: Secondary | ICD-10-CM

## 2024-05-26 DIAGNOSIS — M19012 Primary osteoarthritis, left shoulder: Secondary | ICD-10-CM | POA: Diagnosis not present

## 2024-05-26 NOTE — Progress Notes (Signed)
 Office Visit Note   Patient: Daniel GORMAN Rhein, Daniel Payne           Date of Birth: 1945-11-30           MRN: [id number] Visit Date: 05/26/2024 Requested by: [name], Daniel Payne [address] [place],  [place] [address] PCP: [name], Daniel Payne  Subjective: Chief Complaint  Patient presents with   Left Shoulder - Pain    HPI: Daniel GORMAN Rhein, Daniel Payne is a 78 y.o. male who presents to the office reporting left shoulder pain.  Patient underwent left glenohumeral joint injection for arthritis and rotator cuff arthropathy about 7 weeks ago.  Had good relief until 2 to 3 weeks ago.  Would like to have another injection performed.  He has been active and playing golf and exercising.  Denies any interval history of trauma or injury..                ROS: All systems reviewed are negative as they relate to the chief complaint within the history of present illness.  Patient denies fevers or chills.  Assessment & Plan: Visit Diagnoses:  1. Left shoulder pain, unspecified chronicity   2. 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  Ultrasound-guided glenohumeral joint injection performed today.  Although it is off label I have had several patients with very good results with gel injection into the shoulder.  We will consider that option for him as well in the future.  He wants to avoid major intervention if possible.  Follow-Up Instructions: No follow-ups on file.   Orders:  Orders Placed This Encounter  Procedures   US  Guided Needle Placement - No Linked Charges   No orders of the defined types were placed in this encounter.     Procedures: Large Joint Inj: L glenohumeral on 05/26/2024 5:40 PM Indications: diagnostic evaluation and pain Details: 22 G 3.5 in needle, ultrasound-guided posterior approach  Arthrogram: No  Medications: 9 mL bupivacaine  0.5 %; 5 mL lidocaine  1 %; 40 mg triamcinolone  acetonide 40  MG/ML Outcome: tolerated well, no immediate complications Procedure, treatment alternatives, risks and benefits explained, specific risks discussed. Consent was given by the patient. Immediately prior to procedure a time out was called to verify the correct patient, procedure, equipment, support staff and site/side marked as required. Patient was prepped and draped in the usual sterile fashion.       Clinical Data: No additional findings.  Objective: Vital Signs: There were no vitals taken for this visit.  Physical Exam:  Constitutional: Patient appears well-developed HEENT:  Head: Normocephalic Eyes:EOM are normal Neck: Normal range of motion Cardiovascular: Normal rate Pulmonary/chest: Effort normal Neurologic: Patient is alert Skin: Skin is warm Psychiatric: Patient has normal mood and affect  Ortho Exam: Ortho exam demonstrates good range of motion on the right with range of motion on the left of about 40/95/160.  Has good external rotation strength bilaterally at 5 out of 5.  Mild crepitus with passive range of motion.  No AC joint tenderness.  Motor or sensory function of the hands intact.  Specialty Comments:  No specialty comments available.  Imaging: US  Guided Needle Placement - No Linked Charges Result Date: 05/26/2024 Ultrasound imaging demonstrates needle placement into the glenohumeral joint with injection of fluid into the joint and no complicating features.     PMFS History: Patient Active Problem List   Diagnosis Date Noted   Hyperlipidemia  04/01/2023   Pacemaker 12/12/2020   AV block, Mobitz 2 09/10/2020   Chronotropic incompetence 08/21/2020   DOE (dyspnea on exertion) 07/27/2020   Pain in left shoulder 10/12/2019   ADD (attention deficit disorder) 05/19/2019   CA of prostate (HCC) 05/19/2019   Hypercholesterolemia without hypertriglyceridemia 05/19/2019   Left sided colitis without complications (HCC) 05/19/2019   Tachycardia 05/19/2019    Oropharyngeal dysphagia 11/03/2018   PAF (paroxysmal atrial fibrillation) (HCC) 11/07/2014   Past Medical History:  Diagnosis Date   A-fib [facility])    ADHD (attention deficit hyperactivity disorder)    Cancer (HCC)    prostate dr. renay davis   Chronic kidney disease    borderline stage III   Fissure, anal    Giardia    working in [place] [employer]   Hyperlipidemia    Infectious mononucleosis    Low back pain    Presbycusis of left ear    Spinal stenosis of lumbar region    Ulcerative colitis (HCC)     Family History  Problem Relation Age of Onset   Stroke Mother 68   Heart attack Father     Past Surgical History:  Procedure Laterality Date   ANAL FISSURE REPAIR     APPENDECTOMY     PACEMAKER IMPLANT N/A 09/10/2020   Procedure: PACEMAKER IMPLANT;  Surgeon: Waddell Danelle ORN, Daniel Payne;  [facility]: [facility];  Service: Cardiovascular;  Laterality: N/A;   PROSTATE SURGERY     ROTATOR CUFF REPAIR Left    VARICOCELE EXCISION Right    Social History   Occupational History   Not on file  Tobacco Use   Smoking status: Never   Smokeless tobacco: Never  Vaping Use   Vaping status: Never Used  Substance and Sexual Activity   Alcohol use: Yes    Alcohol/week: 0.0 standard drinks of alcohol    Comment: 1-2 glasses of red wine   Drug use: No   Sexual activity: Not on file

## 2024-05-28 MED ORDER — LIDOCAINE HCL 1 % IJ SOLN
5.0000 mL | INTRAMUSCULAR | Status: AC | PRN
  Administered 2024-05-26: 5 mL

## 2024-05-28 MED ORDER — BUPIVACAINE HCL 0.5 % IJ SOLN
9.0000 mL | INTRAMUSCULAR | Status: AC | PRN
  Administered 2024-05-26: 9 mL via INTRA_ARTICULAR

## 2024-05-28 MED ORDER — TRIAMCINOLONE ACETONIDE 40 MG/ML IJ SUSP
40.0000 mg | INTRAMUSCULAR | Status: AC | PRN
  Administered 2024-05-26: 40 mg via INTRA_ARTICULAR

## 2024-06-14 NOTE — Progress Notes (Signed)
 Remote PPM Transmission

## 2024-06-23 ENCOUNTER — Ambulatory Visit

## 2024-06-23 DIAGNOSIS — I480 Paroxysmal atrial fibrillation: Secondary | ICD-10-CM

## 2024-06-26 LAB — CUP PACEART REMOTE DEVICE CHECK
Battery Remaining Longevity: 115 mo
Battery Voltage: 3.01 V
Brady Statistic AP VP Percent: 4.29 %
Brady Statistic AP VS Percent: 0 %
Brady Statistic AS VP Percent: 95.51 %
Brady Statistic AS VS Percent: 0.14 %
Brady Statistic RA Percent Paced: 3.96 %
Brady Statistic RV Percent Paced: 99.86 %
Date Time Interrogation Session: 20251016025359
Implantable Lead Connection Status: 753985
Implantable Lead Connection Status: 753985
Implantable Lead Implant Date: 20220103
Implantable Lead Implant Date: 20220103
Implantable Lead Location: 753859
Implantable Lead Location: 753860
Implantable Lead Model: 3830
Implantable Lead Model: 5076
Implantable Pulse Generator Implant Date: 20220103
Lead Channel Impedance Value: 361 Ohm
Lead Channel Impedance Value: 380 Ohm
Lead Channel Impedance Value: 418 Ohm
Lead Channel Impedance Value: 513 Ohm
Lead Channel Pacing Threshold Amplitude: 0.375 V
Lead Channel Pacing Threshold Amplitude: 0.875 V
Lead Channel Pacing Threshold Pulse Width: 0.4 ms
Lead Channel Pacing Threshold Pulse Width: 0.4 ms
Lead Channel Sensing Intrinsic Amplitude: 0.75 mV
Lead Channel Sensing Intrinsic Amplitude: 0.75 mV
Lead Channel Sensing Intrinsic Amplitude: 21.25 mV
Lead Channel Sensing Intrinsic Amplitude: 21.25 mV
Lead Channel Setting Pacing Amplitude: 1.5 V
Lead Channel Setting Pacing Amplitude: 2 V
Lead Channel Setting Pacing Pulse Width: 0.4 ms
Lead Channel Setting Sensing Sensitivity: 1.2 mV
Zone Setting Status: 755011

## 2024-06-29 ENCOUNTER — Ambulatory Visit: Payer: Self-pay | Admitting: Internal Medicine

## 2024-06-29 NOTE — Progress Notes (Signed)
 Remote PPM Transmission

## 2024-07-04 ENCOUNTER — Telehealth: Payer: Self-pay | Admitting: Cardiovascular Disease

## 2024-07-04 DIAGNOSIS — I480 Paroxysmal atrial fibrillation: Secondary | ICD-10-CM

## 2024-07-04 MED ORDER — APIXABAN 5 MG PO TABS: 5.0000 mg | ORAL_TABLET | Freq: Two times a day (BID) | ORAL | 1 refills | Status: AC

## 2024-07-04 MED ORDER — DILTIAZEM HCL ER 120 MG PO CP24: 120.0000 mg | ORAL_CAPSULE | Freq: Every day | ORAL | 0 refills | Status: DC

## 2024-07-04 NOTE — Telephone Encounter (Signed)
 Pt due for an appt in January 2026.  90 day refill for Diltiazem  120 mg has been sent to [facility], per pt's request.  Will forward the Eliquis  refill to the pharmacy staff.

## 2024-07-04 NOTE — Telephone Encounter (Signed)
 Eliquis  5mg  refill request received. Patient is 78 years old, weight-66.8kg, Crea-1.31 on 04/06/24, Diagnosis-Afib, and last seen by Jon Hails on 04/06/24. Dose is appropriate based on dosing criteria. Will send in refill to requested pharmacy.

## 2024-07-04 NOTE — Telephone Encounter (Signed)
*  STAT* If patient is at the pharmacy, call can be transferred to refill team.   1. Which medications need to be refilled? (please list name of each medication and dose if known)   DILT-XR 120 MG 24 hr capsule  apixaban  (ELIQUIS ) 5 MG TABS tablet   2. Would you like to learn more about the convenience, safety, & potential cost savings by using the [facility]?   3. Are you open to using the [facility] (Type [facility]. ).  4. Which pharmacy/location (including street and city if local pharmacy) is medication to be sent to?  [id number] - [place],  - [address] AT [facility] [address]   5. Do they need a 30 day or 90 day supply?   90 day  Patient stated he is close to being out of these medications.

## 2024-07-11 ENCOUNTER — Encounter: Payer: Self-pay | Admitting: Radiology

## 2024-07-20 ENCOUNTER — Other Ambulatory Visit: Payer: Self-pay

## 2024-07-20 ENCOUNTER — Ambulatory Visit: Admitting: Orthopedic Surgery

## 2024-07-20 DIAGNOSIS — M25512 Pain in left shoulder: Secondary | ICD-10-CM | POA: Diagnosis not present

## 2024-07-20 DIAGNOSIS — M19012 Primary osteoarthritis, left shoulder: Secondary | ICD-10-CM

## 2024-07-21 DIAGNOSIS — M19012 Primary osteoarthritis, left shoulder: Secondary | ICD-10-CM

## 2024-07-21 NOTE — Progress Notes (Signed)
 Office Visit Note   Patient: Daniel GORMAN Rhein, MD           Date of Birth: 02-Oct-1945           MRN: [id number] Visit Date: 07/20/2024 Requested by: [name], MD 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  Pain.  Left glenohumeral joint injection performed 05/26/2024.  Did have pretty good relief until several weeks ago.  Now the shoulder is very painful again.  Pain does hurt him at night.  Waking up multiple times with shoulder symptoms.  Still really does everything that he wants to do but has significant symptoms with activity..                ROS: All systems reviewed are negative as they relate to the chief complaint within the history of present illness.  Patient denies fevers or chills.  Assessment & Plan: Visit Diagnoses:  1. Left shoulder pain, unspecified chronicity   2. Arthritis of left shoulder     Plan: Impression is left shoulder glenohumeral joint arthritis.  Synvisc 1 injection performed.  I think this has a chance to give him some relief which she understands even though it is an off label use.  I have had success with several patients in the past with that particular intervention.  Otherwise we will plan to see him back in March for repeat evaluation and possible further injections.  Follow-Up Instructions: No follow-ups on file.   Orders:  Orders Placed This Encounter  Procedures   US  Guided Needle Placement - No Linked Charges   No orders of the defined types were placed in this encounter.     Procedures: Large Joint Inj: L glenohumeral on 07/21/2024 6:20 AM Indications: diagnostic evaluation and pain Details: 22 G 3.5 in needle, ultrasound-guided posterior approach  Arthrogram: No  Outcome: tolerated well, no  immediate complications Procedure, treatment alternatives, risks and benefits explained, specific risks discussed. Consent was given by the patient. Immediately prior to procedure a time out was called to verify the correct patient, procedure, equipment, support staff and site/side marked as required. Patient was prepped and draped in the usual sterile fashion.    Synvisc 1 injected under ultrasound guidance   Clinical Data: No additional findings.  Objective: Vital Signs: There were no vitals taken for this visit.  Physical Exam:  Constitutional: Patient appears well-developed HEENT:  Head: Normocephalic Eyes:EOM are normal Neck: Normal range of motion Cardiovascular: Normal rate Pulmonary/chest: Effort normal Neurologic: Patient is alert Skin: Skin is warm Psychiatric: Patient has normal mood and affect  Ortho Exam: Ortho exam demonstrates good range of motion on the right with range of motion on the left of about 40/95/160. Has good external rotation strength bilaterally at 5 out of 5. Mild crepitus with passive range of motion. No AC joint tenderness. Motor or sensory function of the hands intact.  Exam essentially unchanged from prior visit  Specialty Comments:  No specialty comments available.  Imaging: No results found.   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 renay davis   Chronic kidney disease    borderline stage III   Fissure, anal    Giardia    working in [place]   Hyperlipidemia    Infectious mononucleosis    Low back pain    Presbycusis of left ear    Spinal stenosis of lumbar region    Ulcerative colitis (HCC)     Family History  Problem Relation Age of Onset   Stroke Mother 68   Heart attack Father     Past Surgical History:  Procedure Laterality Date   ANAL FISSURE REPAIR     APPENDECTOMY     PACEMAKER IMPLANT N/A 09/10/2020   Procedure: PACEMAKER IMPLANT;  Surgeon: Waddell Danelle ORN, MD;  Location: [facility];  Service: Cardiovascular;  Laterality: N/A;   PROSTATE SURGERY     ROTATOR CUFF REPAIR Left    VARICOCELE EXCISION Right    Social History   Occupational History   Not on file  Tobacco Use   Smoking status: Never   Smokeless tobacco: Never  Vaping Use   Vaping status: Never Used  Substance and Sexual Activity   Alcohol use: Yes    Alcohol/week: 0.0 standard drinks of alcohol    Comment: 1-2 glasses of red wine   Drug use: No   Sexual activity: Not on file

## 2024-07-23 ENCOUNTER — Encounter: Payer: Self-pay | Admitting: Orthopedic Surgery

## 2024-07-27 ENCOUNTER — Encounter: Payer: Self-pay | Admitting: Cardiovascular Disease

## 2024-07-27 ENCOUNTER — Telehealth: Payer: Self-pay | Admitting: Emergency Medicine

## 2024-07-27 NOTE — Telephone Encounter (Signed)
 Fax sent to provider for cardiac clearance and recommendations

## 2024-07-29 ENCOUNTER — Telehealth: Payer: Self-pay | Admitting: Orthopedic Surgery

## 2024-07-29 NOTE — Telephone Encounter (Signed)
 Called and worked in

## 2024-07-29 NOTE — Telephone Encounter (Signed)
 Yes.  Can you bring him in sometime next week for an injection in his shoulder.  This would be the last 1 for the year.  In general I do not like doing more than 3 injections/year in any one joint.  Please call thanks

## 2024-07-29 NOTE — Telephone Encounter (Signed)
 Patient called. Says the injection has not worked. Would like someone to call him. Can he get an injection in his shoulder?

## 2024-07-29 NOTE — Telephone Encounter (Signed)
 Lauren please advise. Schedule says no workins

## 2024-08-01 ENCOUNTER — Other Ambulatory Visit: Payer: Self-pay

## 2024-08-01 ENCOUNTER — Ambulatory Visit: Admitting: Orthopedic Surgery

## 2024-08-01 DIAGNOSIS — M25512 Pain in left shoulder: Secondary | ICD-10-CM

## 2024-08-02 ENCOUNTER — Encounter: Payer: Self-pay | Admitting: Orthopedic Surgery

## 2024-08-02 MED ORDER — BUPIVACAINE HCL 0.5 % IJ SOLN
9.0000 mL | INTRAMUSCULAR | Status: AC | PRN
  Administered 2024-08-01: 9 mL via INTRA_ARTICULAR

## 2024-08-02 MED ORDER — TRIAMCINOLONE ACETONIDE 40 MG/ML IJ SUSP
40.0000 mg | INTRAMUSCULAR | Status: AC | PRN
  Administered 2024-08-01: 40 mg via INTRA_ARTICULAR

## 2024-08-02 MED ORDER — LIDOCAINE HCL 1 % IJ SOLN
5.0000 mL | INTRAMUSCULAR | Status: AC | PRN
  Administered 2024-08-01: 5 mL

## 2024-08-02 NOTE — Progress Notes (Signed)
 Office Visit Note   Patient: Daniel GORMAN Rhein, Daniel Payne           Date of Birth: Mar 07, 1946           MRN: [id number] Visit Date: 08/01/2024 Requested by: [name], Daniel Payne [address] [place],  [place] [address] PCP: [name], Daniel Payne  Subjective: Chief Complaint  Patient presents with   Left Shoulder - Follow-up    HPI: Daniel GORMAN Rhein, Daniel Payne is a 78 y.o. male who presents to the office reporting left shoulder pain.  Patient had gel injection into the shoulder without relief.  Started to bother him now when he is trying to sleep.  He is okay during the day but when he rolls over onto that left shoulder at night it is painful for him..                ROS: All systems reviewed are negative as they relate to the chief complaint within the history of present illness.  Patient denies fevers or chills.  Assessment & Plan: Visit Diagnoses:  1. Left shoulder pain, unspecified chronicity     Plan: Impression is symptomatic left shoulder arthritis.  Glenohumeral joint injection #3 of the years performed today.  Will see him back in March for clinical recheck.  He may need reverse replacement at sometime in the future but he is currently managing reasonly well with his shoulder function the way it is.  He is modifying activity as well to accommodate some of the symptoms he is having.  Follow-Up Instructions: No follow-ups on file.   Orders:  Orders Placed This Encounter  Procedures   US  Guided Needle Placement - No Linked Charges   No orders of the defined types were placed in this encounter.     Procedures: Large Joint Inj: L glenohumeral on 08/01/2024 11:00 AM Indications: diagnostic evaluation and pain Details: 22 G 3.5 in needle, ultrasound-guided posterior approach  Arthrogram: No  Medications: 9 mL bupivacaine  0.5 %; 5 mL lidocaine  1 %; 40 mg triamcinolone  acetonide 40 MG/ML Outcome: tolerated well, no immediate complications Procedure, treatment  alternatives, risks and benefits explained, specific risks discussed. Consent was given by the patient. Immediately prior to procedure a time out was called to verify the correct patient, procedure, equipment, support staff and site/side marked as required. Patient was prepped and draped in the usual sterile fashion.       Clinical Data: No additional findings.  Objective: Vital Signs: There were no vitals taken for this visit.  Physical Exam:  Constitutional: Patient appears well-developed HEENT:  Head: Normocephalic Eyes:EOM are normal Neck: Normal range of motion Cardiovascular: Normal rate Pulmonary/chest: Effort normal Neurologic: Patient is alert Skin: Skin is warm Psychiatric: Patient has normal mood and affect  Ortho Exam: Ortho exam demonstrates good deltoid function on the left.  Passively his motion remains intact as well to approximately 40/90/160.  Has good external rotation strength as well as subscap strength in both shoulders.  No discrete AC joint tenderness.  Specialty Comments:  No specialty comments available.  Imaging: No results found.   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 renay davis   Chronic kidney disease    borderline stage III   Fissure, anal    Giardia     working in [place]   Hyperlipidemia    Infectious mononucleosis    Low back pain    Presbycusis of left ear    Spinal stenosis of lumbar region    Ulcerative colitis (HCC)     Family History  Problem Relation Age of Onset   Stroke Mother 16   Heart attack Father     Past Surgical History:  Procedure Laterality Date   ANAL FISSURE REPAIR     APPENDECTOMY     PACEMAKER IMPLANT N/A 09/10/2020   Procedure: PACEMAKER IMPLANT;  Surgeon: Waddell Danelle ORN, Daniel Payne;  Location: [facility];  Service: Cardiovascular;  Laterality: N/A;   PROSTATE SURGERY     ROTATOR CUFF REPAIR Left    VARICOCELE EXCISION Right    Social History   Occupational History   Not on file  Tobacco Use   Smoking status: Never   Smokeless tobacco: Never  Vaping Use   Vaping status: Never Used  Substance and Sexual Activity   Alcohol use: Yes    Alcohol/week: 0.0 standard drinks of alcohol    Comment: 1-2 glasses of red wine   Drug use: No   Sexual activity: Not on file

## 2024-08-10 DIAGNOSIS — I480 Paroxysmal atrial fibrillation: Secondary | ICD-10-CM | POA: Diagnosis not present

## 2024-08-10 DIAGNOSIS — Z8546 Personal history of malignant neoplasm of prostate: Secondary | ICD-10-CM | POA: Diagnosis not present

## 2024-08-10 DIAGNOSIS — Z0000 Encounter for general adult medical examination without abnormal findings: Secondary | ICD-10-CM | POA: Diagnosis not present

## 2024-08-10 DIAGNOSIS — M4807 Spinal stenosis, lumbosacral region: Secondary | ICD-10-CM | POA: Diagnosis not present

## 2024-08-10 DIAGNOSIS — D6869 Other thrombophilia: Secondary | ICD-10-CM | POA: Diagnosis not present

## 2024-08-10 DIAGNOSIS — N1831 Chronic kidney disease, stage 3a: Secondary | ICD-10-CM | POA: Diagnosis not present

## 2024-08-10 DIAGNOSIS — E559 Vitamin D deficiency, unspecified: Secondary | ICD-10-CM | POA: Diagnosis not present

## 2024-08-10 DIAGNOSIS — I441 Atrioventricular block, second degree: Secondary | ICD-10-CM | POA: Diagnosis not present

## 2024-08-10 DIAGNOSIS — F5101 Primary insomnia: Secondary | ICD-10-CM | POA: Diagnosis not present

## 2024-08-10 DIAGNOSIS — K5190 Ulcerative colitis, unspecified, without complications: Secondary | ICD-10-CM | POA: Diagnosis not present

## 2024-08-10 DIAGNOSIS — E7800 Pure hypercholesterolemia, unspecified: Secondary | ICD-10-CM | POA: Diagnosis not present

## 2024-08-10 DIAGNOSIS — R053 Chronic cough: Secondary | ICD-10-CM | POA: Diagnosis not present

## 2024-08-10 DIAGNOSIS — Z79899 Other long term (current) drug therapy: Secondary | ICD-10-CM | POA: Diagnosis not present

## 2024-08-18 ENCOUNTER — Telehealth: Payer: Self-pay | Admitting: Radiology

## 2024-08-18 DIAGNOSIS — S83411A Sprain of medial collateral ligament of right knee, initial encounter: Secondary | ICD-10-CM | POA: Diagnosis not present

## 2024-08-18 DIAGNOSIS — M1711 Unilateral primary osteoarthritis, right knee: Secondary | ICD-10-CM | POA: Diagnosis not present

## 2024-08-18 NOTE — Telephone Encounter (Signed)
 Patient left voicemail on triage. Currently in [place]  skiing and has injured right knee. He is requesting to see Dr. Addie Monday or Tuesday. Tuesday he is in surgery and Monday is full. What time can I possibly work patient in?   CB # [phone]

## 2024-08-18 NOTE — Telephone Encounter (Signed)
 Left voicemail. Placed patient on schedule for Monday 12/15 at 2:15pm

## 2024-08-22 ENCOUNTER — Encounter: Admitting: Orthopedic Surgery

## 2024-09-14 ENCOUNTER — Encounter: Payer: Self-pay | Admitting: Cardiovascular Disease

## 2024-09-14 ENCOUNTER — Telehealth (HOSPITAL_BASED_OUTPATIENT_CLINIC_OR_DEPARTMENT_OTHER): Payer: Self-pay | Admitting: *Deleted

## 2024-09-14 NOTE — Telephone Encounter (Signed)
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 Date of next office visit: NONE   Request for Surgical Clearance    Procedure:  COLONOSCOPY  Date of Surgery:  Clearance 12/05/24                                Surgeon:  DR. ROSALIE  Surgeon's Group or Practice Name:  [facility] Phone number:  [phone] Fax number:  [phone]   Type of Clearance Requested:   - Medical  - Pharmacy:  Hold Apixaban  (Eliquis )     Type of Anesthesia:   PROPOFOL   Additional requests/questions:    Bonney Niels Jest   09/14/2024, 11:05 AM   "

## 2024-09-22 ENCOUNTER — Ambulatory Visit

## 2024-09-22 DIAGNOSIS — I442 Atrioventricular block, complete: Secondary | ICD-10-CM

## 2024-09-28 LAB — CUP PACEART REMOTE DEVICE CHECK
Battery Remaining Longevity: 110 mo
Battery Voltage: 3.01 V
Brady Statistic AP VP Percent: 8.36 %
Brady Statistic AP VS Percent: 0 %
Brady Statistic AS VP Percent: 91.57 %
Brady Statistic AS VS Percent: 0.06 %
Brady Statistic RA Percent Paced: 8.39 %
Brady Statistic RV Percent Paced: 99.93 %
Date Time Interrogation Session: 20260120161218
Implantable Lead Connection Status: 753985
Implantable Lead Connection Status: 753985
Implantable Lead Implant Date: 20220103
Implantable Lead Implant Date: 20220103
Implantable Lead Location: 753859
Implantable Lead Location: 753860
Implantable Lead Model: 3830
Implantable Lead Model: 5076
Implantable Pulse Generator Implant Date: 20220103
Lead Channel Impedance Value: 342 Ohm
Lead Channel Impedance Value: 342 Ohm
Lead Channel Impedance Value: 380 Ohm
Lead Channel Impedance Value: 475 Ohm
Lead Channel Pacing Threshold Amplitude: 0.5 V
Lead Channel Pacing Threshold Amplitude: 0.875 V
Lead Channel Pacing Threshold Pulse Width: 0.4 ms
Lead Channel Pacing Threshold Pulse Width: 0.4 ms
Lead Channel Sensing Intrinsic Amplitude: 0.625 mV
Lead Channel Sensing Intrinsic Amplitude: 0.625 mV
Lead Channel Sensing Intrinsic Amplitude: 26 mV
Lead Channel Sensing Intrinsic Amplitude: 26 mV
Lead Channel Setting Pacing Amplitude: 1.5 V
Lead Channel Setting Pacing Amplitude: 2 V
Lead Channel Setting Pacing Pulse Width: 0.4 ms
Lead Channel Setting Sensing Sensitivity: 1.2 mV
Zone Setting Status: 755011

## 2024-09-29 NOTE — Progress Notes (Signed)
 Remote PPM Transmission

## 2024-10-01 ENCOUNTER — Ambulatory Visit: Payer: Self-pay | Admitting: Cardiovascular Disease

## 2024-10-03 NOTE — Telephone Encounter (Signed)
 Patient with diagnosis of Afib on Eliquis  for anticoagulation.    Procedure: Colonoscopy Date of procedure: 12/05/24   CHA2DS2-VASc Score = 3   This indicates a 3.2% annual risk of stroke. The patient's score is based upon: CHF History: 0 HTN History: 0 Diabetes History: 0 Stroke History: 0 Vascular Disease History: 1 Age Score: 2 Gender Score: 0   CrCl 46 mL/min Platelet count 254  Patient has not had an Afib/aflutter ablation in the last 3 months, DCCV within the last 4 weeks or a watchman implanted in the last 45 days     Per office protocol, patient can hold Eliquis  for 2 days prior to procedure.   Patient will not need bridging with Lovenox (enoxaparin) around procedure.  **This guidance is not considered finalized until pre-operative APP has relayed final recommendations.**

## 2024-10-04 NOTE — Telephone Encounter (Signed)
" ° °  Name: Daniel GORMAN Rhein, MD  DOB: [birth year]  MRN: [id number]  Primary Cardiologist: None   Preoperative team, please contact this patient and set up a phone call appointment for further preoperative risk assessment. Please obtain consent and complete medication review. Thank you for your help.  I confirm that guidance regarding antiplatelet and oral anticoagulation therapy has been completed and, if necessary, noted below. - Per office protocol, patient can hold Eliquis  for 2 days prior to procedure. Patient will not need bridging with Lovenox (enoxaparin) around procedure.  I also confirmed the patient resides in the state of  . As per [facility] Medical Board telemedicine laws, the patient must reside in the state in which the provider is licensed.   Charlye Spare E Maciah Schweigert, PA-C 10/04/2024, 9:17 PM [facility]    "

## 2024-10-05 ENCOUNTER — Other Ambulatory Visit: Payer: Self-pay | Admitting: Cardiovascular Disease

## 2024-10-05 ENCOUNTER — Telehealth: Payer: Self-pay

## 2024-10-05 NOTE — Telephone Encounter (Signed)
 Appointment is scheduled for 12/01/24 at 1:40. Med req and consent are complete. Call Dr. Tona at [phone].

## 2024-10-05 NOTE — Telephone Encounter (Signed)
 Patient has been rescheduled for 11/03/24. He stated that the colonoscopy is on 11/10/24.

## 2024-10-05 NOTE — Telephone Encounter (Signed)
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  Appointment is scheduled for 12/01/24 at 1:40. Med req and consent are complete. Call Dr. Rhein at [phone].   CONSENT FOR VIRTUAL VISIT FOR:  Daniel GORMAN Rhein, MD  By participating in this virtual visit I agree to the following:  I hereby voluntarily request, consent and authorize [facility] and its employed or contracted physicians, physician assistants, nurse practitioners or other licensed health care professionals (the Practitioner), to provide me with telemedicine health care services (the Services) as deemed necessary by the treating Practitioner. I acknowledge and consent to receive the Services by the Practitioner via telemedicine. I understand that the telemedicine visit will involve communicating with the Practitioner through live audiovisual communication technology and the disclosure of certain medical information by electronic transmission. I acknowledge that I have been given the opportunity to request an in-person assessment or other available alternative prior to the telemedicine visit and am voluntarily participating in the telemedicine visit.  I understand that I have the right to withhold or withdraw my consent to the use of telemedicine in the course of my care at any time, without affecting my right to future care or treatment, and that the Practitioner or I may terminate the telemedicine visit at any time. I understand that I have the right to inspect all information obtained and/or recorded in the course of the telemedicine visit and may receive copies of available information for a reasonable fee.  I understand that some of the potential risks of receiving the Services via telemedicine include:  Delay or interruption in medical evaluation due to technological equipment failure or disruption; Information transmitted  may not be sufficient (e.g. poor resolution of images) to allow for appropriate medical decision making by the Practitioner; and/or  In rare instances, security protocols could fail, causing a breach of personal health information.  Furthermore, I acknowledge that it is my responsibility to provide information about my medical history, conditions and care that is complete and accurate to the best of my ability. I acknowledge that Practitioner's advice, recommendations, and/or decision may be based on factors not within their control, such as incomplete or inaccurate data provided by me or distortions of diagnostic images or specimens that may result from electronic transmissions. I understand that the practice of medicine is not an exact science and that Practitioner makes no warranties or guarantees regarding treatment outcomes. I acknowledge that a copy of this consent can be made available to me via my patient portal [facility] MyChart), or I can request a printed copy by calling the office of [facility].    I understand that my insurance will be billed for this visit.   I have read or had this consent read to me. I understand the contents of this consent, which adequately explains the benefits and risks of the Services being provided via telemedicine.  I have been provided ample opportunity to ask questions regarding this consent and the Services and have had my questions answered to my satisfaction. I give my informed consent for the services to be provided through the use of telemedicine in my medical care    "

## 2024-10-05 NOTE — Telephone Encounter (Signed)
 Patient stated his colonoscopy is now scheduled on 11/10/24 and will need sooner telephone clearance visit

## 2024-11-03 ENCOUNTER — Ambulatory Visit

## 2024-11-09 ENCOUNTER — Ambulatory Visit: Admitting: Orthopedic Surgery

## 2024-12-01 ENCOUNTER — Ambulatory Visit

## 2024-12-12 ENCOUNTER — Ambulatory Visit: Admitting: Cardiovascular Disease

## 2024-12-22 ENCOUNTER — Ambulatory Visit

## 2025-03-23 ENCOUNTER — Ambulatory Visit

## 2025-06-22 ENCOUNTER — Ambulatory Visit

## 2025-09-21 ENCOUNTER — Ambulatory Visit
